# Patient Record
Sex: Female | Born: 1985 | Hispanic: No | Marital: Single | Smoking: Never smoker
Health system: Southern US, Community
[De-identification: ages and names within clinical notes are randomized; demographics above are authoritative.]

## PROBLEM LIST (undated history)

## (undated) DIAGNOSIS — C50919 Malignant neoplasm of unspecified site of unspecified female breast: Secondary | ICD-10-CM

## (undated) DIAGNOSIS — Z8481 Family history of carrier of genetic disease: Secondary | ICD-10-CM

## (undated) DIAGNOSIS — Z803 Family history of malignant neoplasm of breast: Secondary | ICD-10-CM

## (undated) DIAGNOSIS — R000 Tachycardia, unspecified: Secondary | ICD-10-CM

## (undated) DIAGNOSIS — R51 Headache: Secondary | ICD-10-CM

## (undated) DIAGNOSIS — R519 Headache, unspecified: Secondary | ICD-10-CM

## (undated) DIAGNOSIS — R569 Unspecified convulsions: Secondary | ICD-10-CM

## (undated) DIAGNOSIS — M797 Fibromyalgia: Secondary | ICD-10-CM

## (undated) DIAGNOSIS — Z806 Family history of leukemia: Secondary | ICD-10-CM

## (undated) DIAGNOSIS — I951 Orthostatic hypotension: Secondary | ICD-10-CM

## (undated) DIAGNOSIS — S069X9A Unspecified intracranial injury with loss of consciousness of unspecified duration, initial encounter: Secondary | ICD-10-CM

## (undated) DIAGNOSIS — N8003 Adenomyosis of the uterus: Secondary | ICD-10-CM

## (undated) DIAGNOSIS — Z9889 Other specified postprocedural states: Secondary | ICD-10-CM

## (undated) DIAGNOSIS — N809 Endometriosis, unspecified: Secondary | ICD-10-CM

## (undated) DIAGNOSIS — A1801 Tuberculosis of spine: Secondary | ICD-10-CM

## (undated) DIAGNOSIS — N800 Endometriosis of uterus: Secondary | ICD-10-CM

## (undated) HISTORY — PX: APPENDECTOMY: SHX54

## (undated) HISTORY — PX: BREAST SURGERY: SHX581

## (undated) HISTORY — PX: ENDOMETRIAL ABLATION: SHX621

## (undated) HISTORY — DX: Malignant neoplasm of unspecified site of unspecified female breast: C50.919

## (undated) HISTORY — DX: Family history of leukemia: Z80.6

## (undated) HISTORY — DX: Family history of malignant neoplasm of breast: Z80.3

## (undated) HISTORY — DX: Family history of carrier of genetic disease: Z84.81

## (undated) HISTORY — PX: KNEE ARTHROSCOPY: SHX127

---

## 2012-04-15 DIAGNOSIS — G90A Postural orthostatic tachycardia syndrome (POTS): Secondary | ICD-10-CM

## 2012-04-15 DIAGNOSIS — I498 Other specified cardiac arrhythmias: Secondary | ICD-10-CM

## 2012-04-15 HISTORY — DX: Postural orthostatic tachycardia syndrome (POTS): G90.A

## 2012-04-15 HISTORY — DX: Other specified cardiac arrhythmias: I49.8

## 2014-12-06 ENCOUNTER — Emergency Department (HOSPITAL_COMMUNITY): Payer: Self-pay

## 2014-12-06 ENCOUNTER — Encounter (HOSPITAL_COMMUNITY): Payer: Self-pay | Admitting: Emergency Medicine

## 2014-12-06 ENCOUNTER — Emergency Department (HOSPITAL_COMMUNITY)
Admission: EM | Admit: 2014-12-06 | Discharge: 2014-12-06 | Disposition: A | Discharge status: Home / Self Care | Payer: Self-pay | Attending: Emergency Medicine | Admitting: Emergency Medicine

## 2014-12-06 DIAGNOSIS — R61 Generalized hyperhidrosis: Secondary | ICD-10-CM | POA: Insufficient documentation

## 2014-12-06 DIAGNOSIS — N938 Other specified abnormal uterine and vaginal bleeding: Secondary | ICD-10-CM | POA: Insufficient documentation

## 2014-12-06 DIAGNOSIS — R1032 Left lower quadrant pain: Secondary | ICD-10-CM | POA: Insufficient documentation

## 2014-12-06 DIAGNOSIS — Z3202 Encounter for pregnancy test, result negative: Secondary | ICD-10-CM | POA: Insufficient documentation

## 2014-12-06 DIAGNOSIS — Z880 Allergy status to penicillin: Secondary | ICD-10-CM | POA: Insufficient documentation

## 2014-12-06 DIAGNOSIS — Z8619 Personal history of other infectious and parasitic diseases: Secondary | ICD-10-CM | POA: Insufficient documentation

## 2014-12-06 DIAGNOSIS — Z8742 Personal history of other diseases of the female genital tract: Secondary | ICD-10-CM | POA: Insufficient documentation

## 2014-12-06 HISTORY — DX: Adenomyosis of the uterus: N80.03

## 2014-12-06 HISTORY — DX: Endometriosis of uterus: N80.0

## 2014-12-06 HISTORY — DX: Endometriosis, unspecified: N80.9

## 2014-12-06 HISTORY — DX: Tuberculosis of spine: A18.01

## 2014-12-06 LAB — COMPREHENSIVE METABOLIC PANEL
ALBUMIN: 3.8 g/dL (ref 3.5–5.0)
ALT: 11 U/L — ABNORMAL LOW (ref 14–54)
ANION GAP: 6 (ref 5–15)
AST: 18 U/L (ref 15–41)
Alkaline Phosphatase: 68 U/L (ref 38–126)
BUN: 11 mg/dL (ref 6–20)
CHLORIDE: 110 mmol/L (ref 101–111)
CO2: 24 mmol/L (ref 22–32)
Calcium: 8.8 mg/dL — ABNORMAL LOW (ref 8.9–10.3)
Creatinine, Ser: 0.87 mg/dL (ref 0.44–1.00)
GFR calc Af Amer: >60 mL/min (ref >60)
GFR calc non Af Amer: >60 mL/min (ref >60)
GLUCOSE: 103 mg/dL — AB (ref 65–99)
POTASSIUM: 3.6 mmol/L (ref 3.5–5.1)
Sodium: 140 mmol/L (ref 135–145)
Total Bilirubin: 0.7 mg/dL (ref 0.3–1.2)
Total Protein: 7.2 g/dL (ref 6.5–8.1)

## 2014-12-06 LAB — CBC WITH DIFFERENTIAL/PLATELET
BASOS ABS: 0 10*3/uL (ref 0.0–0.1)
Basophils Relative: 0 % (ref 0–1)
EOS PCT: 1 % (ref 0–5)
Eosinophils Absolute: 0.1 10*3/uL (ref 0.0–0.7)
HEMATOCRIT: 36.1 % (ref 36.0–46.0)
Hemoglobin: 12.1 g/dL (ref 12.0–15.0)
LYMPHS ABS: 1.7 10*3/uL (ref 0.7–4.0)
LYMPHS PCT: 21 % (ref 12–46)
MCH: 27.8 pg (ref 26.0–34.0)
MCHC: 33.5 g/dL (ref 30.0–36.0)
MCV: 82.8 fL (ref 78.0–100.0)
MONO ABS: 0.4 10*3/uL (ref 0.1–1.0)
Monocytes Relative: 4 % (ref 3–12)
NEUTROS ABS: 5.8 10*3/uL (ref 1.7–7.7)
Neutrophils Relative %: 74 % (ref 43–77)
PLATELETS: 307 10*3/uL (ref 150–400)
RBC: 4.36 MIL/uL (ref 3.87–5.11)
RDW: 14.7 % (ref 11.5–15.5)
WBC: 7.9 10*3/uL (ref 4.0–10.5)

## 2014-12-06 LAB — I-STAT BETA HCG BLOOD, ED (MC, WL, AP ONLY): I-stat hCG, quantitative: <5 m[IU]/mL (ref <5)

## 2014-12-06 LAB — WET PREP, GENITAL
CLUE CELLS WET PREP: NONE SEEN
TRICH WET PREP: NONE SEEN
Yeast Wet Prep HPF POC: NONE SEEN

## 2014-12-06 MED ORDER — ONDANSETRON 4 MG PO TBDP: 4.0000 mg | ORAL_TABLET | Freq: Two times a day (BID) | ORAL | Status: DC

## 2014-12-06 MED ORDER — ONDANSETRON 4 MG PO TBDP
ORAL_TABLET | ORAL | Status: AC
  Filled 2014-12-06: qty 2

## 2014-12-06 MED ORDER — ONDANSETRON 4 MG PO TBDP
8.0000 mg | ORAL_TABLET | Freq: Once | ORAL | Status: AC
  Administered 2014-12-06: 8 mg via ORAL

## 2014-12-06 MED ORDER — HYDROCODONE-ACETAMINOPHEN 5-325 MG PO TABS
1.0000 | ORAL_TABLET | Freq: Once | ORAL | Status: AC
  Administered 2014-12-06: 1 via ORAL
  Filled 2014-12-06: qty 1

## 2014-12-06 MED ORDER — HYDROCODONE-ACETAMINOPHEN 5-325 MG PO TABS: 1.0000 | ORAL_TABLET | Freq: Four times a day (QID) | ORAL | Status: DC | PRN

## 2014-12-06 NOTE — ED Provider Notes (Signed)
CSN: [id number]     Arrival date & time 12/06/14  1055 History  This chart was scribed for non-physician practitioner, Junius Creamer, NP, working with Merrily Pew, MD, by Stephania Fragmin, ED Scribe. This patient was seen in room TR03C/TR03C and the patient's care was started at 1:25 PM.    Chief Complaint  Patient presents with  . Abdominal Pain   The history is provided by the patient. No language interpreter was used.    HPI Comments: Joanna Harris is a 29 y.o. female with a history of endometriosis and ovarian cysts, who presents to the Emergency Department complaining of constant, gradually improving vaginal bleeding and concurrent LLQ abdominal pain that began 3 days ago, she started her period. Patient states this feels different than her typical periods because she was nauseated and diaphoretic when she woke up this morning. She reports she has been passing large blood clots, the size of a quarter, but this has lightened up since onset; the abdominal pain has been unchanged since onset. She also took Aleve, which normally alleviates her abdominal cramping, with no relief. Patient called her OB-GYN in [place], who told her to come to the ED. Patient had a last normal BM a couple hours ago, with no relief to her symptoms. She notes a history of endometriosis but states her symptoms today feel more severe. She states she last had issues with ovarian cysts over 10 years ago. She denies a history of diverticulitis or any issues with constipation. She denies dysuria. She also denies any vaginal discharge prior to the onset of her abdominal pain. She also denies the possibility of pregnancy.   Past Medical History  Diagnosis Date  . Endometriosis   . Adenomyosis of uterus determined by biopsy   . Potts disease    No past surgical history on file. No family history on file. Social History  Substance Use Topics  . Smoking status: Not on file  . Smokeless tobacco: Not on file  . Alcohol Use: Not  on file   OB History    No data available     Review of Systems  Constitutional: Positive for diaphoresis. Negative for fever and chills.  Respiratory: Negative for cough and shortness of breath.   Cardiovascular: Negative for chest pain.  Gastrointestinal: Positive for nausea and abdominal pain. Negative for vomiting, diarrhea and constipation.  Genitourinary: Positive for vaginal bleeding. Negative for dysuria, vaginal discharge and vaginal pain.  Musculoskeletal: Negative for myalgias.  Skin: Negative for rash.  All other systems reviewed and are negative.     Allergies  Amoxicillin and Ketorolac  Home Medications   Prior to Admission medications   Not on File   BP 116/85 mmHg  Pulse 86  Temp(Src) 97.9 F (36.6 C) (Oral)  Resp 18  SpO2 99%  LMP 12/03/2014 Physical Exam  Constitutional: She is oriented to person, place, and time. She appears well-developed and well-nourished. No distress.  HENT:  Head: Normocephalic and atraumatic.  Mouth/Throat: Oropharynx is clear and moist.  Eyes: Conjunctivae and EOM are normal. Pupils are equal, round, and reactive to light.  Neck: Normal range of motion. Neck supple. Tracheal deviation present.  Cardiovascular: Normal rate and regular rhythm.   Pulmonary/Chest: Effort normal and breath sounds normal.  Abdominal: Soft. Bowel sounds are normal. She exhibits no distension. There is tenderness in the suprapubic area and left lower quadrant. There is no rigidity and no guarding.  Genitourinary: Vagina normal. Cervix exhibits no discharge. Right adnexum displays tenderness. Right  adnexum displays no mass. Left adnexum displays tenderness. Left adnexum displays no mass. No vaginal discharge found.  Musculoskeletal: Normal range of motion.  Neurological: She is alert and oriented to person, place, and time.  Skin: Skin is warm and dry.  Psychiatric: She has a normal mood and affect. Her behavior is normal.  Nursing note and vitals  reviewed.   ED Course  Procedures (including critical care time)  DIAGNOSTIC STUDIES: Oxygen Saturation is 99% on RA, normal by my interpretation.    COORDINATION OF CARE: 1:27 PM - Discussed treatment plan with pt at bedside which includes pelvic exam and diagnostic tests. Pt verbalized understanding and agreed to plan.  Labs Review Labs Reviewed  WET PREP, GENITAL - Abnormal; Notable for the following:    WBC, Wet Prep HPF POC FEW (*)    All other components within normal limits  COMPREHENSIVE METABOLIC PANEL - Abnormal; Notable for the following:    Glucose, Bld 103 (*)    Calcium 8.8 (*)    ALT 11 (*)    All other components within normal limits  CBC WITH DIFFERENTIAL/PLATELET  I-STAT BETA HCG BLOOD, ED ([facility], [facility], [facility] ONLY)  GC/CHLAMYDIA PROBE AMP ([place]) NOT AT [facility]    Imaging Review US Transvaginal Non-ob  12/06/2014   CLINICAL DATA:  Patient with 3 days of left lower quadrant pain.  EXAM: TRANSABDOMINAL AND TRANSVAGINAL ULTRASOUND OF PELVIS  TECHNIQUE: Both transabdominal and transvaginal ultrasound examinations of the pelvis were performed. Transabdominal technique was performed for global imaging of the pelvis including uterus, ovaries, adnexal regions, and pelvic cul-de-sac. It was necessary to proceed with endovaginal exam following the transabdominal exam to visualize the adnexal structures.  COMPARISON:  None  FINDINGS: Uterus  Measurements: 6.9 x 3.4 x 4.3 cm. No fibroids or other mass visualized.  Endometrium  Thickness: 7 mm.  No focal abnormality visualized.  Right ovary  Measurements: 2.9 x 2.6 x 2.8 cm. Normal appearance/no adnexal mass.  Left ovary  Measurements: 2.7 x 1.4 x 2.2 cm. Normal appearance/no adnexal mass.  Other findings  Trace free fluid in the pelvis.  IMPRESSION: Unremarkable pelvic ultrasound.   Electronically Signed   By: Lovey Newcomer M.D.   On: 12/06/2014 16:11   US Pelvis Complete  12/06/2014   CLINICAL DATA:  Patient with 3 days of left lower  quadrant pain.  EXAM: TRANSABDOMINAL AND TRANSVAGINAL ULTRASOUND OF PELVIS  TECHNIQUE: Both transabdominal and transvaginal ultrasound examinations of the pelvis were performed. Transabdominal technique was performed for global imaging of the pelvis including uterus, ovaries, adnexal regions, and pelvic cul-de-sac. It was necessary to proceed with endovaginal exam following the transabdominal exam to visualize the adnexal structures.  COMPARISON:  None  FINDINGS: Uterus  Measurements: 6.9 x 3.4 x 4.3 cm. No fibroids or other mass visualized.  Endometrium  Thickness: 7 mm.  No focal abnormality visualized.  Right ovary  Measurements: 2.9 x 2.6 x 2.8 cm. Normal appearance/no adnexal mass.  Left ovary  Measurements: 2.7 x 1.4 x 2.2 cm. Normal appearance/no adnexal mass.  Other findings  Trace free fluid in the pelvis.  IMPRESSION: Unremarkable pelvic ultrasound.   Electronically Signed   By: Lovey Newcomer M.D.   On: 12/06/2014 16:11   I have personally reviewed and evaluated these images and lab results as part of my medical decision-making.   EKG Interpretation None     patient's ultrasound reviewed.  Labs, urine reviewed.  Wet prep, reviewed all within normal parameters.  KUB was  obtained.  For thoroughness sake, which was normal.  Patient now informs me that she has surgery planned for endometriosis through her OB/GYN.  End of September.  She has been given a prescription for Vicodin 10 tablets that she can use for severe pain, as well as a densitometry for nausea  MDM   Final diagnoses:  None    I personally performed the services described in this documentation, which was scribed in my presence. The recorded information has been reviewed and is accurate.    Junius Creamer, NP 12/06/14 1707  Merrily Pew, MD 12/09/14 [phone]

## 2014-12-06 NOTE — ED Notes (Signed)
Pt to ultrasound at this time.

## 2014-12-06 NOTE — Discharge Instructions (Signed)
Abdominal Pain Many things can cause belly (abdominal) pain. Most times, the belly pain is not dangerous. Many cases of belly pain can be watched and treated at home. HOME CARE   Do not take medicines that help you go poop (laxatives) unless told to by your doctor.  Only take medicine as told by your doctor.  Eat or drink as told by your doctor. Your doctor will tell you if you should be on a special diet. GET HELP IF:  You do not know what is causing your belly pain.  You have belly pain while you are sick to your stomach (nauseous) or have runny poop (diarrhea).  You have pain while you pee or poop.  Your belly pain wakes you up at night.  You have belly pain that gets worse or better when you eat.  You have belly pain that gets worse when you eat fatty foods.  You have a fever. GET HELP RIGHT AWAY IF:   The pain does not go away within 2 hours.  You keep throwing up (vomiting).  The pain changes and is only in the right or left part of the belly.  You have bloody or tarry looking poop. MAKE SURE YOU:   Understand these instructions.  Will watch your condition.  Will get help right away if you are not doing well or get worse. Document Released: 09/18/2007 Document Revised: 04/06/2013 Document Reviewed: 12/09/2012 [facility] Patient Information 2015 [place], [place]. This information is not intended to replace advice given to you by your health care provider. Make sure you discuss any questions you have with your health care provider. Today your ultrasound, x-ray, urine and labs are all within normal parameters You have been given a medication called dancer try and which is for nausea and hydrocodone that he can take for pain.  Please keep your appointment with your OB/GYN

## 2014-12-06 NOTE — ED Notes (Signed)
Pt has left sided abdominal pain; thinks has had fevers; tender to palpation; appendectomy in 2000. Is on period but states this pain is much worse. States she has endometriosis and ovarian cysts but has been vomiting and diaphoretic. States this is not how usually feels when on period. Passing large clots and bleeding then stops.

## 2014-12-07 LAB — GC/CHLAMYDIA PROBE AMP (~~LOC~~) NOT AT ARMC
CHLAMYDIA, DNA PROBE: POSITIVE — AB
NEISSERIA GONORRHEA: NEGATIVE

## 2014-12-08 ENCOUNTER — Telehealth (HOSPITAL_BASED_OUTPATIENT_CLINIC_OR_DEPARTMENT_OTHER): Payer: Self-pay | Admitting: Emergency Medicine

## 2014-12-08 NOTE — Telephone Encounter (Signed)
Chart handoff to EDP for treatment plan + Chlamydia

## 2014-12-09 ENCOUNTER — Telehealth (HOSPITAL_BASED_OUTPATIENT_CLINIC_OR_DEPARTMENT_OTHER): Payer: Self-pay | Admitting: Emergency Medicine

## 2014-12-10 ENCOUNTER — Telehealth (HOSPITAL_COMMUNITY): Payer: Self-pay | Admitting: Emergency Medicine

## 2014-12-10 NOTE — Telephone Encounter (Signed)
Post ED Visit - Positive Culture Follow-up: Successful Patient Follow-Up   Positive Chlamydia culture  [x]  Patient discharged without antimicrobial prescription and treatment is now indicated []  Organism is resistant to prescribed ED discharge antimicrobial []  Patient with positive blood cultures  Changes discussed with ED provider: Blanchie Dessert, MD New antibiotic prescription: Azithromycin 1, 000 mg PO once Called to [facility]  Contacted patient, date 12/10/14, time 1208   Ernesta Amble 12/10/2014, 12:17 PM

## 2015-01-04 ENCOUNTER — Encounter (HOSPITAL_COMMUNITY): Payer: Self-pay | Admitting: Emergency Medicine

## 2015-01-04 ENCOUNTER — Emergency Department (HOSPITAL_COMMUNITY)
Admission: EM | Admit: 2015-01-04 | Discharge: 2015-01-04 | Disposition: A | Discharge status: Home / Self Care | Payer: Self-pay | Attending: Emergency Medicine | Admitting: Emergency Medicine

## 2015-01-04 DIAGNOSIS — I498 Other specified cardiac arrhythmias: Secondary | ICD-10-CM | POA: Insufficient documentation

## 2015-01-04 DIAGNOSIS — I951 Orthostatic hypotension: Secondary | ICD-10-CM

## 2015-01-04 DIAGNOSIS — Z79899 Other long term (current) drug therapy: Secondary | ICD-10-CM | POA: Insufficient documentation

## 2015-01-04 DIAGNOSIS — G90A Postural orthostatic tachycardia syndrome (POTS): Secondary | ICD-10-CM

## 2015-01-04 DIAGNOSIS — Z8619 Personal history of other infectious and parasitic diseases: Secondary | ICD-10-CM | POA: Insufficient documentation

## 2015-01-04 DIAGNOSIS — R300 Dysuria: Secondary | ICD-10-CM | POA: Insufficient documentation

## 2015-01-04 DIAGNOSIS — Z8742 Personal history of other diseases of the female genital tract: Secondary | ICD-10-CM | POA: Insufficient documentation

## 2015-01-04 DIAGNOSIS — Z9889 Other specified postprocedural states: Secondary | ICD-10-CM | POA: Insufficient documentation

## 2015-01-04 DIAGNOSIS — G8918 Other acute postprocedural pain: Secondary | ICD-10-CM | POA: Insufficient documentation

## 2015-01-04 DIAGNOSIS — Z3202 Encounter for pregnancy test, result negative: Secondary | ICD-10-CM | POA: Insufficient documentation

## 2015-01-04 DIAGNOSIS — R55 Syncope and collapse: Secondary | ICD-10-CM | POA: Insufficient documentation

## 2015-01-04 DIAGNOSIS — R000 Tachycardia, unspecified: Secondary | ICD-10-CM

## 2015-01-04 HISTORY — DX: Other specified postprocedural states: Z98.890

## 2015-01-04 LAB — URINALYSIS, ROUTINE W REFLEX MICROSCOPIC
BILIRUBIN URINE: NEGATIVE
GLUCOSE, UA: NEGATIVE mg/dL
Hgb urine dipstick: NEGATIVE
KETONES UR: NEGATIVE mg/dL
LEUKOCYTES UA: NEGATIVE
NITRITE: NEGATIVE
PH: 8 (ref 5.0–8.0)
Protein, ur: NEGATIVE mg/dL
SPECIFIC GRAVITY, URINE: 1.01 (ref 1.005–1.030)
Urobilinogen, UA: 0.2 mg/dL (ref 0.0–1.0)

## 2015-01-04 LAB — CBC
HEMATOCRIT: 37.4 % (ref 36.0–46.0)
Hemoglobin: 12.2 g/dL (ref 12.0–15.0)
MCH: 27.9 pg (ref 26.0–34.0)
MCHC: 32.6 g/dL (ref 30.0–36.0)
MCV: 85.4 fL (ref 78.0–100.0)
PLATELETS: 313 10*3/uL (ref 150–400)
RBC: 4.38 MIL/uL (ref 3.87–5.11)
RDW: 13.8 % (ref 11.5–15.5)
WBC: 8.4 10*3/uL (ref 4.0–10.5)

## 2015-01-04 LAB — BASIC METABOLIC PANEL
Anion gap: 7 (ref 5–15)
BUN: 6 mg/dL (ref 6–20)
CHLORIDE: 111 mmol/L (ref 101–111)
CO2: 20 mmol/L — AB (ref 22–32)
CREATININE: 0.79 mg/dL (ref 0.44–1.00)
Calcium: 9.1 mg/dL (ref 8.9–10.3)
GFR calc Af Amer: >60 mL/min (ref >60)
GFR calc non Af Amer: >60 mL/min (ref >60)
Glucose, Bld: 95 mg/dL (ref 65–99)
POTASSIUM: 4.1 mmol/L (ref 3.5–5.1)
Sodium: 138 mmol/L (ref 135–145)

## 2015-01-04 LAB — I-STAT TROPONIN, ED: TROPONIN I, POC: 0 ng/mL (ref 0.00–0.08)

## 2015-01-04 LAB — POC URINE PREG, ED: Preg Test, Ur: NEGATIVE

## 2015-01-04 MED ORDER — SODIUM CHLORIDE 0.9 % IV BOLUS (SEPSIS)
1000.0000 mL | Freq: Once | INTRAVENOUS | Status: AC
  Administered 2015-01-04: 1000 mL via INTRAVENOUS

## 2015-01-04 NOTE — Discharge Instructions (Signed)
Stay hydrated. Eat more salt to keep you from passing out.   See your GYN surgeon for follow up.   Return to ER if you have severe pain, passing out, unable to urinate.

## 2015-01-04 NOTE — ED Provider Notes (Signed)
CSN: [id number]     Arrival date & time 01/04/15  1938 History   First MD Initiated Contact with Patient 01/04/15 2105     Chief Complaint  Patient presents with  . Urinary Frequency  . Loss of Consciousness     (Consider location/radiation/quality/duration/timing/severity/associated sxs/prior Treatment) The history is provided by the patient.  Joanna Harris is a 29 y.o. female hx of endometriosis, POTS, here presenting with urinary frequency, bladder pain. Patient had endometrial ablation done at [facility] 6 days ago. Went to ED 5 days ago with urinary frequency and bladder pressure and was thought to have normal postop pain. She continues to have some blood pressure and frequency. Also passed out twice today. Felt lightheaded and dizzy. She does have a history of POTS. Denies chest pain and no hx of CAD.     Past Medical History  Diagnosis Date  . Endometriosis   . Adenomyosis of uterus determined by biopsy   . Potts disease   . S/P endometrial ablation    History reviewed. No pertinent past surgical history. No family history on file. Social History  Substance Use Topics  . Smoking status: Never Smoker   . Smokeless tobacco: None  . Alcohol Use: No   OB History    No data available     Review of Systems  Cardiovascular: Positive for syncope.  Genitourinary: Positive for frequency.  All other systems reviewed and are negative.     Allergies  Amoxicillin; Ketorolac; and Ibuprofen  Home Medications   Prior to Admission medications   Medication Sig Start Date End Date Taking? Authorizing Provider  amphetamine-dextroamphetamine (ADDERALL) 5 MG tablet Take 60 mg by mouth daily as needed (thought stabilizer).    Yes Historical Provider, MD  B Complex Vitamins (VITAMIN-B COMPLEX) TABS Take 1 tablet by mouth daily.   Yes Historical Provider, MD  BIOTIN PO Take 1 tablet by mouth daily.   Yes Historical Provider, MD  calcium elemental as carbonate (PX ANTACID MAXIMUM  STRENGTH) 400 MG tablet Chew 1 tablet by mouth daily.   Yes Historical Provider, MD  cetirizine (ZYRTEC) 10 MG tablet Take 10 mg by mouth daily.   Yes Historical Provider, MD  Cholecalciferol (PA VITAMIN D-3) 2000 UNITS CAPS Take 1 tablet by mouth daily.   Yes Historical Provider, MD  ferrous sulfate 325 (65 FE) MG tablet Take 325 mg by mouth every other day.   Yes Historical Provider, MD  Ginkgo Biloba 500 MG CAPS Take 1 tablet by mouth daily.   Yes Historical Provider, MD  Magnesium 250 MG TABS Take 500 mg by mouth every other day.   Yes Historical Provider, MD  niacin (NIASPAN) 1000 MG CR tablet Take 1,000 mg by mouth daily.   Yes Historical Provider, MD  ondansetron (ZOFRAN-ODT) 4 MG disintegrating tablet Take 1 tablet (4 mg total) by mouth 2 (two) times daily. 12/06/14  Yes Junius Creamer, NP  oxyCODONE (OXY IR/ROXICODONE) 5 MG immediate release tablet Take 10 mg by mouth every 4 (four) hours as needed. 12/31/14  Yes Historical Provider, MD  oxyCODONE-acetaminophen (PERCOCET) 7.5-325 MG per tablet TK 1 TO 2 TS PO Q 6 H PRN P 12/30/14  Yes Historical Provider, MD  topiramate (TOPAMAX) 100 MG tablet Take 300 mg by mouth daily.   Yes Historical Provider, MD  ALPRAZolam Duanne Moron) 0.5 MG tablet Take 0.5 mg by mouth 3 (three) times daily as needed.    Historical Provider, MD   BP 119/72 mmHg  Pulse 67  Temp(Src)  98.3 F (36.8 C) (Oral)  Resp 16  Ht 5\' 2"  (1.575 m)  Wt 227 lb (102.967 kg)  BMI 41.51 kg/m2  LMP 12/04/2014 Physical Exam  Constitutional: She is oriented to person, place, and time. She appears well-developed and well-nourished.  HENT:  Head: Normocephalic.  Mouth/Throat: Oropharynx is clear and moist.  Eyes: Conjunctivae are normal. Pupils are equal, round, and reactive to light.  Neck: Normal range of motion. Neck supple.  Cardiovascular: Normal rate, regular rhythm and normal heart sounds.   Pulmonary/Chest: Effort normal and breath sounds normal. No respiratory distress. She has  no wheezes. She has no rales.  Abdominal: Soft. Bowel sounds are normal. She exhibits no distension. There is no tenderness. There is no rebound.  Scars healing well. Minimal suprapubic tenderness   Musculoskeletal: Normal range of motion. She exhibits no edema or tenderness.  Neurological: She is alert and oriented to person, place, and time. No cranial nerve deficit. Coordination normal.  Skin: Skin is warm and dry.  Psychiatric: She has a normal mood and affect. Her behavior is normal. Judgment and thought content normal.  Nursing note and vitals reviewed.   ED Course  Procedures (including critical care time) Labs Review Labs Reviewed  BASIC METABOLIC PANEL - Abnormal; Notable for the following:    CO2 20 (*)    All other components within normal limits  CBC  URINALYSIS, ROUTINE W REFLEX MICROSCOPIC (NOT AT [facility])  POC URINE PREG, ED  I-STAT TROPOININ, ED    Imaging Review No results found. I have personally reviewed and evaluated these images and lab results as part of my medical decision-making.   EKG Interpretation   Date/Time:  Wednesday January 04 2015 20:16:47 EDT Ventricular Rate:  62 PR Interval:  104 QRS Duration: 76 QT Interval:  404 QTC Calculation: 410 R Axis:   95 Text Interpretation:  Sinus rhythm with short PR Rightward axis Borderline  ECG No previous ECGs available Confirmed by YAO  MD, DAVID ([id number]) on  01/04/2015 9:01:55 PM      MDM   Final diagnoses:  None    Joanna Harris is a 29 y.o. female here with syncope, urinary frequency. Likely postop urinary retention vs postop pain vs POTS. Will get labs, bladder scan, UA, orthostatics.   11:06 PM Not orthostatic. UA nl. Bladder scan 100. Bicarb 20, given IVF. Ambulated with no syncope. Will dc home.     Wandra Arthurs, MD 01/04/15 [phone]

## 2015-01-04 NOTE — ED Notes (Signed)
Pt. reports urinary retention/frequency  , bladder pain/pressure onset this week , S/P endometrial ablation last Friday at [facility] , pt. added syncopal episode x2 today . Alert and oriented / respirations unlabored.

## 2015-01-04 NOTE — ED Notes (Signed)
Pt stable, ambulatory, states understanding of discharge instructions 

## 2015-05-21 DIAGNOSIS — E669 Obesity, unspecified: Secondary | ICD-10-CM | POA: Insufficient documentation

## 2015-05-21 DIAGNOSIS — R531 Weakness: Secondary | ICD-10-CM | POA: Insufficient documentation

## 2015-05-21 DIAGNOSIS — Z853 Personal history of malignant neoplasm of breast: Secondary | ICD-10-CM | POA: Insufficient documentation

## 2015-05-21 DIAGNOSIS — R110 Nausea: Secondary | ICD-10-CM | POA: Insufficient documentation

## 2015-05-21 DIAGNOSIS — Z79899 Other long term (current) drug therapy: Secondary | ICD-10-CM | POA: Insufficient documentation

## 2015-05-21 DIAGNOSIS — M5432 Sciatica, left side: Secondary | ICD-10-CM | POA: Insufficient documentation

## 2015-05-21 DIAGNOSIS — Z8742 Personal history of other diseases of the female genital tract: Secondary | ICD-10-CM | POA: Insufficient documentation

## 2015-05-21 DIAGNOSIS — Z880 Allergy status to penicillin: Secondary | ICD-10-CM | POA: Insufficient documentation

## 2015-05-21 DIAGNOSIS — Z8619 Personal history of other infectious and parasitic diseases: Secondary | ICD-10-CM | POA: Insufficient documentation

## 2015-05-21 DIAGNOSIS — Z3202 Encounter for pregnancy test, result negative: Secondary | ICD-10-CM | POA: Insufficient documentation

## 2015-05-21 DIAGNOSIS — R200 Anesthesia of skin: Secondary | ICD-10-CM | POA: Insufficient documentation

## 2015-05-22 ENCOUNTER — Encounter (HOSPITAL_COMMUNITY): Payer: Self-pay | Admitting: *Deleted

## 2015-05-22 ENCOUNTER — Emergency Department (HOSPITAL_COMMUNITY)
Admission: EM | Admit: 2015-05-22 | Discharge: 2015-05-22 | Disposition: A | Discharge status: Home / Self Care | Payer: Self-pay | Attending: Emergency Medicine | Admitting: Emergency Medicine

## 2015-05-22 ENCOUNTER — Emergency Department (HOSPITAL_COMMUNITY): Payer: Self-pay

## 2015-05-22 DIAGNOSIS — M5432 Sciatica, left side: Secondary | ICD-10-CM

## 2015-05-22 DIAGNOSIS — M549 Dorsalgia, unspecified: Secondary | ICD-10-CM

## 2015-05-22 LAB — URINALYSIS, ROUTINE W REFLEX MICROSCOPIC
Bilirubin Urine: NEGATIVE
Glucose, UA: NEGATIVE mg/dL
HGB URINE DIPSTICK: NEGATIVE
Ketones, ur: NEGATIVE mg/dL
Leukocytes, UA: NEGATIVE
Nitrite: NEGATIVE
PH: 8 (ref 5.0–8.0)
Protein, ur: NEGATIVE mg/dL
SPECIFIC GRAVITY, URINE: 1.03 (ref 1.005–1.030)

## 2015-05-22 LAB — POC URINE PREG, ED: PREG TEST UR: NEGATIVE

## 2015-05-22 MED ORDER — OXYCODONE-ACETAMINOPHEN 5-325 MG PO TABS
1.0000 | ORAL_TABLET | Freq: Once | ORAL | Status: AC
  Administered 2015-05-22: 1 via ORAL
  Filled 2015-05-22: qty 1

## 2015-05-22 MED ORDER — ONDANSETRON HCL 4 MG/2ML IJ SOLN
4.0000 mg | Freq: Once | INTRAMUSCULAR | Status: AC
  Administered 2015-05-22: 4 mg via INTRAVENOUS
  Filled 2015-05-22: qty 2

## 2015-05-22 MED ORDER — HYDROMORPHONE HCL 1 MG/ML IJ SOLN
1.0000 mg | Freq: Once | INTRAMUSCULAR | Status: AC
  Administered 2015-05-22: 1 mg via INTRAVENOUS
  Filled 2015-05-22: qty 1

## 2015-05-22 MED ORDER — OXYCODONE-ACETAMINOPHEN 5-325 MG PO TABS: 1.0000 | ORAL_TABLET | Freq: Four times a day (QID) | ORAL | Status: DC | PRN

## 2015-05-22 MED ORDER — METHYLPREDNISOLONE 4 MG PO TBPK: ORAL_TABLET | ORAL | Status: DC

## 2015-05-22 NOTE — ED Notes (Signed)
Patient transported to MRI 

## 2015-05-22 NOTE — ED Notes (Signed)
The pt is c/o lt hip and leg numbness and pain for one week.  tonigth she lost her balance and fell   She lost control of her bladder.  No known injury  lmp jan 17

## 2015-05-22 NOTE — ED Provider Notes (Signed)
CSN: [id number]     Arrival date & time 05/21/15  2337 History  By signing my name below, I, Stephania Fragmin, attest that this documentation has been prepared under the direction and in the presence of Merryl Hacker, MD. Electronically Signed: Stephania Fragmin, ED Scribe. 05/22/2015. 6:28 AM.   Chief Complaint  Patient presents with  . Hip Pain   The history is provided by the patient. No language interpreter was used.    HPI Comments: Joanna Harris is a 30 y.o. female with a history of breast cancer in remission, endometriosis, adenomyosis of uterus, and Potts disease, who presents to the Emergency Department complaining of left hip numbness and 7/10 pain  radiating into her lower back and left buttock, that began last week after getting a massage. Her pain acutely worsened today when patient's left leg became weak and she fell, losing control of her bladder. She denies any fall or injuries prior to today. She notes associated tingling and nausea. Patient notes a history of breast cancer but states she is in remission. Sitting exacerbates her pain.She has not taken any treatments or medications for her symptoms. She denies a history of IVDA. She denies the possibility of pregnancy.   Past Medical History  Diagnosis Date  . Endometriosis   . Adenomyosis of uterus determined by biopsy   . Potts disease   . S/P endometrial ablation    History reviewed. No pertinent past surgical history. No family history on file. Social History  Substance Use Topics  . Smoking status: Never Smoker   . Smokeless tobacco: None  . Alcohol Use: No   OB History    No data available     Review of Systems  Constitutional: Negative for fever.  Gastrointestinal: Positive for nausea.  Genitourinary:       Urinary incontinence  Musculoskeletal: Positive for back pain, arthralgias (left hip pain) and gait problem.  Neurological: Positive for weakness and numbness.  All other systems reviewed and are  negative.  Allergies  Amoxicillin; Ketorolac; and Ibuprofen  Home Medications   Prior to Admission medications   Medication Sig Start Date End Date Taking? Authorizing Provider  ALPRAZolam Duanne Moron) 0.5 MG tablet Take 0.5 mg by mouth 3 (three) times daily as needed for anxiety.    Yes Historical Provider, MD  amphetamine-dextroamphetamine (ADDERALL) 5 MG tablet Take 60 mg by mouth daily as needed (thought stabilizer).    Yes Historical Provider, MD  B Complex Vitamins (VITAMIN-B COMPLEX) TABS Take 1 tablet by mouth daily.   Yes Historical Provider, MD  BIOTIN PO Take 1 tablet by mouth daily.   Yes Historical Provider, MD  cetirizine (ZYRTEC) 10 MG tablet Take 10 mg by mouth daily.   Yes Historical Provider, MD  Cholecalciferol (PA VITAMIN D-3) 2000 UNITS CAPS Take 1 tablet by mouth daily.   Yes Historical Provider, MD  ferrous sulfate 325 (65 FE) MG tablet Take 325 mg by mouth every other day.   Yes Historical Provider, MD  Ginkgo Biloba 500 MG CAPS Take 1 tablet by mouth daily.   Yes Historical Provider, MD  lurasidone (LATUDA) 20 MG TABS tablet Take 20 mg by mouth daily.   Yes Historical Provider, MD  Magnesium 250 MG TABS Take 500 mg by mouth every other day.   Yes Historical Provider, MD  niacin (NIASPAN) 1000 MG CR tablet Take 1,000 mg by mouth daily.   Yes Historical Provider, MD  methylPREDNISolone (MEDROL DOSEPAK) 4 MG TBPK tablet Take as directed on  package insert. 05/22/15   Merryl Hacker, MD  oxyCODONE-acetaminophen (PERCOCET/ROXICET) 5-325 MG tablet Take 1-2 tablets by mouth every 6 (six) hours as needed for severe pain. 05/22/15   Merryl Hacker, MD   BP 93/51 mmHg  Pulse 57  Temp(Src) 98.3 F (36.8 C) (Oral)  Resp 22  Wt 225 lb 1 oz (102.088 kg)  SpO2 99%  LMP 05/02/2015 Physical Exam  Constitutional: She is oriented to person, place, and time. She appears well-developed and well-nourished. No distress.  Obese  HENT:  Head: Normocephalic and atraumatic.   Cardiovascular: Normal rate, regular rhythm and normal heart sounds.   No murmur heard. Pulmonary/Chest: Effort normal and breath sounds normal. No respiratory distress. She has no wheezes.  Abdominal: Soft. There is no tenderness.  Musculoskeletal: Normal range of motion.  Positive left straight leg raise, tenderness to palpation over the midline lower lumbar spine without step off or deformity noted  Neurological: She is alert and oriented to person, place, and time.  Decreased strength with plantar dorsiflexion of the left foot, no obvious clonus, normal reflexes  Skin: Skin is warm and dry.  Psychiatric: She has a normal mood and affect. Her behavior is normal.  Nursing note and vitals reviewed.   ED Course  Procedures (including critical care time)  DIAGNOSTIC STUDIES: Oxygen Saturation is 100% on RA, normal by my interpretation.    COORDINATION OF CARE: 3:00 AM - Discussed treatment plan with pt at bedside which includes MRI. Pt verbalized understanding and agreed to plan.   Labs Review Labs Reviewed  URINALYSIS, ROUTINE W REFLEX MICROSCOPIC (NOT AT [facility]) - Abnormal; Notable for the following:    APPearance CLOUDY (*)    All other components within normal limits  POC URINE PREG, ED    Imaging Review Mr Lumbar Spine Wo Contrast  05/22/2015  CLINICAL DATA:  LEFT hip numbness and 7/10 back pain for 1 week after getting a message. Urinary incontinence today. History of breast cancer in remission, Potts disease. EXAM: MRI LUMBAR SPINE WITHOUT CONTRAST TECHNIQUE: Multiplanar, multisequence MR imaging of the lumbar spine was performed. No intravenous contrast was administered. COMPARISON:  Abdominal radiographs December 06, 2014 FINDINGS: Lumbar vertebral bodies and posterior elements are intact and aligned with maintenance of lumbar lordosis. Using the reference level of the last well-formed intervertebral disc as L5-S1, intervertebral disc morphology is maintained with decreased T2  signal within the L5-S1 disc consistent with mild desiccation. No suspicious bone marrow signal. Conus medullaris terminates at L1 and appears normal morphology and signal characteristics. 1-2 mm T1 bright signal along the filum terminale with chemical shift artifact consistent with fibro lipomatosis changes. Included prevertebral and paraspinal soft tissues are normal. Level by level evaluation: L1-2, L2-3, L3-4: No disc bulge, canal stenosis nor neural foraminal narrowing. L4-5: Mild annular bulging, mild facet arthropathy and ligamentum flavum redundancy without canal stenosis. Minimal bilateral caudal neural foraminal narrowing. L5-S1: Small central disc protrusion annular fissure. Mild facet arthropathy and ligamentum flavum redundancy without canal stenosis. Minimal bilateral neural foraminal narrowing. IMPRESSION: No acute lumbar spine fracture or malalignment, no MR findings of metastasis by noncontrast examination. No canal stenosis.  Minimal L5-S1 neural foraminal narrowing. 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 Electronically Signed   By: Elon Alas M.D.   On: 05/22/2015 05:20   I have personally reviewed and evaluated these images and lab results as part of my medical decision-making.   EKG Interpretation None      MDM  Final diagnoses:  Back pain  Sciatica of left side   Patient presents with left back, hip, and leg pain. Describes sciatic-like pain. Reports one episode of loss of bladder. Does have a history of cancer. No other red flags. She has midline lower lumbar pain and a positive straight leg raise. Given urinary incontinence and history of cancer, MRI lumbar spine obtained. Patient was given pain and nausea medication. She is able to void spontaneously but states that she feels that she is not voiding completely. Postvoid residual approximate 45 mL.  MRI without evidence of metastasis or other compromise. Patient is ambulatory  independently. Discuss with patient pain medication at home and a Medrol dose pack for inflammation. She is follow-up with her PCP in 2 days for recheck. She was given return precautions.  After history, exam, and medical workup I feel the patient has been appropriately medically screened and is safe for discharge home. Pertinent diagnoses were discussed with the patient. Patient was given return precautions.  I personally performed the services described in this documentation, which was scribed in my presence. The recorded information has been reviewed and is accurate.     Merryl Hacker, MD 05/22/15 0630

## 2015-05-22 NOTE — ED Notes (Signed)
Dr. Horton at bedside at this time.  

## 2015-05-22 NOTE — Discharge Instructions (Signed)

## 2015-05-22 NOTE — ED Notes (Signed)
Notified MRI that patient is ready.

## 2015-05-27 ENCOUNTER — Emergency Department (HOSPITAL_COMMUNITY)
Admission: EM | Admit: 2015-05-27 | Discharge: 2015-05-27 | Disposition: A | Discharge status: Home / Self Care | Payer: Self-pay | Attending: Emergency Medicine | Admitting: Emergency Medicine

## 2015-05-27 ENCOUNTER — Encounter (HOSPITAL_COMMUNITY): Payer: Self-pay | Admitting: Family Medicine

## 2015-05-27 DIAGNOSIS — R300 Dysuria: Secondary | ICD-10-CM | POA: Insufficient documentation

## 2015-05-27 DIAGNOSIS — M5442 Lumbago with sciatica, left side: Secondary | ICD-10-CM | POA: Insufficient documentation

## 2015-05-27 DIAGNOSIS — Z8742 Personal history of other diseases of the female genital tract: Secondary | ICD-10-CM | POA: Insufficient documentation

## 2015-05-27 DIAGNOSIS — Z79899 Other long term (current) drug therapy: Secondary | ICD-10-CM | POA: Insufficient documentation

## 2015-05-27 DIAGNOSIS — Z880 Allergy status to penicillin: Secondary | ICD-10-CM | POA: Insufficient documentation

## 2015-05-27 LAB — URINALYSIS, ROUTINE W REFLEX MICROSCOPIC
BILIRUBIN URINE: NEGATIVE
GLUCOSE, UA: NEGATIVE mg/dL
Hgb urine dipstick: NEGATIVE
Ketones, ur: NEGATIVE mg/dL
Nitrite: NEGATIVE
PH: 6 (ref 5.0–8.0)
Protein, ur: NEGATIVE mg/dL
SPECIFIC GRAVITY, URINE: 1.021 (ref 1.005–1.030)

## 2015-05-27 LAB — URINE MICROSCOPIC-ADD ON: RBC / HPF: NONE SEEN RBC/hpf (ref 0–5)

## 2015-05-27 MED ORDER — DIAZEPAM 5 MG PO TABS
5.0000 mg | ORAL_TABLET | Freq: Once | ORAL | Status: AC
  Administered 2015-05-27: 5 mg via ORAL
  Filled 2015-05-27: qty 1

## 2015-05-27 MED ORDER — LIDOCAINE 5 % EX PTCH: 1.0000 | MEDICATED_PATCH | CUTANEOUS | Status: DC

## 2015-05-27 NOTE — ED Notes (Signed)
PA at bedside.

## 2015-05-27 NOTE — Discharge Instructions (Signed)
Please read and follow all provided instructions.  Your diagnoses today include:  1. Left-sided low back pain with left-sided sciatica    Tests performed today include:  Vital signs - see below for your results today  Medications prescribed:   Take any prescribed medications only as directed.  Home care instructions:   Follow any educational materials contained in this packet  Please rest, use ice or heat on your back for the next several days  Do not lift, push, pull anything more than 10 pounds for the next week  Follow-up instructions: Please follow-up with your primary care provider at your scheduled appointment  Return instructions:  [place] IF YOU HAVE:  New numbness, tingling, weakness, or problem with the use of your arms or legs  Severe back pain not relieved with medications  Loss control of your bowels or bladder  Increasing pain in any areas of the body (such as chest or abdominal pain)  Shortness of breath, dizziness, or fainting.   Worsening nausea (feeling sick to your stomach), vomiting, fever, or sweats  Any other emergent concerns regarding your health   Additional Information:  Your vital signs today were: BP 108/70 mmHg   Pulse 50   Temp(Src) 98 F (36.7 C) (Oral)   Resp 18   SpO2 100%   LMP 05/02/2015 If your blood pressure (BP) was elevated above 135/85 this visit, please have this repeated by your doctor within one month. --------------

## 2015-05-27 NOTE — ED Notes (Signed)
Pt from home for eval of worsening left lower back pain/hip pain. Pt states now pain has moved to her pelvis, denies any vaginal discharge but reports some pressure to bladder. Pt also reports some urinary incontinence when she bends to pick something up a certain way, pt has never had any pregnancies. Pt reports worsening pain and movement to left leg as well.    Pt denies any n/v/d or fevers at this time. Nad ntoed.

## 2015-05-27 NOTE — ED Notes (Signed)
Pt here for increased back pain and pelvic pain. sts seen here recently and the symptoms have progressed. sts urinary incontinence.

## 2015-05-27 NOTE — ED Provider Notes (Signed)
CSN: [id number]     Arrival date & time 05/27/15  1217 History   First MD Initiated Contact with Patient 05/27/15 1318     Chief Complaint  Patient presents with  . Back Pain  . Dysuria   (Consider location/radiation/quality/duration/timing/severity/associated sxs/prior Treatment) HPI 30 y.o. female with a hx of breast cancer in remission, endometriosis, presents to the Emergency Department today complaining of continued back pain/ left hip numbness from previous ED visit on 05-21-15. Pt noted that she had another episode of urinary incontinence while bending over and reaching for an object. Notes similar episode at previous ED visit. Continued left hip numbness consistent with Sciatica as well as left leg numbness to knee. Denies any trauma. No fevers. Endorses similar back pain from previous visit. States that the steroids and narcotics did not really help. Sitting appears to exacerbate back pain. Pain goes away with standing. No hx IVDA. Not pregnant. No CP/SOB/ABD pain. No N/V/D. No other symptoms noted.      Past Medical History  Diagnosis Date  . Endometriosis   . Adenomyosis of uterus determined by biopsy   . Potts disease   . S/P endometrial ablation    History reviewed. No pertinent past surgical history. History reviewed. No pertinent family history. Social History  Substance Use Topics  . Smoking status: Never Smoker   . Smokeless tobacco: None  . Alcohol Use: No   OB History    No data available     Review of Systems ROS reviewed and all are negative for acute change except as noted in the HPI.  Allergies  Amoxicillin; Ketorolac; and Ibuprofen  Home Medications   Prior to Admission medications   Medication Sig Start Date End Date Taking? Authorizing Provider  ALPRAZolam Duanne Moron) 0.5 MG tablet Take 0.5 mg by mouth 3 (three) times daily as needed for anxiety.     Historical Provider, MD  amphetamine-dextroamphetamine (ADDERALL) 5 MG tablet Take 60 mg by mouth daily as  needed (thought stabilizer).     Historical Provider, MD  B Complex Vitamins (VITAMIN-B COMPLEX) TABS Take 1 tablet by mouth daily.    Historical Provider, MD  BIOTIN PO Take 1 tablet by mouth daily.    Historical Provider, MD  cetirizine (ZYRTEC) 10 MG tablet Take 10 mg by mouth daily.    Historical Provider, MD  Cholecalciferol (PA VITAMIN D-3) 2000 UNITS CAPS Take 1 tablet by mouth daily.    Historical Provider, MD  ferrous sulfate 325 (65 FE) MG tablet Take 325 mg by mouth every other day.    Historical Provider, MD  Ginkgo Biloba 500 MG CAPS Take 1 tablet by mouth daily.    Historical Provider, MD  lurasidone (LATUDA) 20 MG TABS tablet Take 20 mg by mouth daily.    Historical Provider, MD  Magnesium 250 MG TABS Take 500 mg by mouth every other day.    Historical Provider, MD  methylPREDNISolone (MEDROL DOSEPAK) 4 MG TBPK tablet Take as directed on package insert. 05/22/15   Merryl Hacker, MD  niacin (NIASPAN) 1000 MG CR tablet Take 1,000 mg by mouth daily.    Historical Provider, MD  oxyCODONE-acetaminophen (PERCOCET/ROXICET) 5-325 MG tablet Take 1-2 tablets by mouth every 6 (six) hours as needed for severe pain. 05/22/15   Merryl Hacker, MD   BP 103/67 mmHg  Pulse 82  Temp(Src) 98 F (36.7 C) (Oral)  Resp 18  SpO2 96%  LMP 05/02/2015   Physical Exam  Constitutional: She is oriented to  person, place, and time. She appears well-developed and well-nourished.  HENT:  Head: Normocephalic and atraumatic.  Eyes: EOM are normal. Pupils are equal, round, and reactive to light.  Neck: Normal range of motion. Neck supple.  Cardiovascular: Normal rate, regular rhythm and normal heart sounds.   Pulmonary/Chest: Effort normal and breath sounds normal.  Abdominal: Soft.  Musculoskeletal: Normal range of motion.       Lumbar back: She exhibits tenderness and pain. She exhibits normal range of motion.  Positive left straight leg raise with TTP of lumbar spine (L3-L5). No step offs or  deformities appreciated.   Neurological: She is alert and oriented to person, place, and time. She has normal strength. No cranial nerve deficit or sensory deficit.  No motor/sensory loss of BLE  Skin: Skin is warm and dry.  Psychiatric: She has a normal mood and affect. Her behavior is normal. Thought content normal.  Nursing note and vitals reviewed.   ED Course  Procedures (including critical care time) Labs Review Labs Reviewed  URINALYSIS, ROUTINE W REFLEX MICROSCOPIC (NOT AT [facility]) - Abnormal; Notable for the following:    Leukocytes, UA MODERATE (*)    All other components within normal limits  URINE MICROSCOPIC-ADD ON - Abnormal; Notable for the following:    Squamous Epithelial / LPF 6-30 (*)    Bacteria, UA FEW (*)    All other components within normal limits   Imaging Review No results found. I have personally reviewed and evaluated these images and lab results as part of my medical decision-making.   EKG Interpretation None      MDM  I have reviewed relevant laboratory values. I have reviewed relevant imaging studies.I have reviewed the relevant previous healthcare records.I obtained HPI from historian. Patient discussed with supervising physician  ED Course:  Assessment: 45y F with hx cancer in remission presents with back, left hip, left leg pain/numbness since previous ED visit on 05-22-15. Reports second episode of bladder incontinence after bending over and reaching for an object below waist height. Based on HPI, sounds like stress incontinence. Previous work up from last ED visit included MRI, which was unremarkable for mets or compromise. Patient feels as though she has urine left over in bladder. On exam, positive left straight leg raise, tender on palpation of lower lumbar spine. NAD. Non-toxic appearing. VSS. Afebrile. Able to ambulate. No other red flags noted. Will DC with pain medication and have her follow up with PCP for management of stress incontinence as  well as Sciatic pain.   Disposition/Plan:  DC Home Additional Verbal discharge instructions given and discussed with patient.  Pt Instructed to f/u with PCP at scheduled appointment on 06-05-15 Strict return precautions given Pt acknowledges and agrees with plan  Supervising Physician Quintella Reichert, MD   Final diagnoses:  Left-sided low back pain with left-sided sciatica      Shary Decamp, PA-C 05/27/15 1456  Quintella Reichert, MD 05/28/15 [phone]

## 2015-05-29 LAB — URINE CULTURE

## 2015-07-03 ENCOUNTER — Emergency Department (HOSPITAL_COMMUNITY): Payer: BLUE CROSS/BLUE SHIELD

## 2015-07-03 ENCOUNTER — Emergency Department (HOSPITAL_COMMUNITY)
Admission: EM | Admit: 2015-07-03 | Discharge: 2015-07-03 | Disposition: A | Discharge status: Home / Self Care | Payer: BLUE CROSS/BLUE SHIELD | Attending: Emergency Medicine | Admitting: Emergency Medicine

## 2015-07-03 ENCOUNTER — Encounter (HOSPITAL_COMMUNITY): Payer: Self-pay | Admitting: Emergency Medicine

## 2015-07-03 DIAGNOSIS — I951 Orthostatic hypotension: Secondary | ICD-10-CM

## 2015-07-03 DIAGNOSIS — I498 Other specified cardiac arrhythmias: Secondary | ICD-10-CM | POA: Diagnosis not present

## 2015-07-03 DIAGNOSIS — Z3202 Encounter for pregnancy test, result negative: Secondary | ICD-10-CM | POA: Diagnosis not present

## 2015-07-03 DIAGNOSIS — Z8619 Personal history of other infectious and parasitic diseases: Secondary | ICD-10-CM | POA: Insufficient documentation

## 2015-07-03 DIAGNOSIS — Z79899 Other long term (current) drug therapy: Secondary | ICD-10-CM | POA: Insufficient documentation

## 2015-07-03 DIAGNOSIS — R110 Nausea: Secondary | ICD-10-CM | POA: Insufficient documentation

## 2015-07-03 DIAGNOSIS — Z8742 Personal history of other diseases of the female genital tract: Secondary | ICD-10-CM | POA: Insufficient documentation

## 2015-07-03 DIAGNOSIS — R000 Tachycardia, unspecified: Secondary | ICD-10-CM

## 2015-07-03 DIAGNOSIS — R0602 Shortness of breath: Secondary | ICD-10-CM | POA: Diagnosis not present

## 2015-07-03 DIAGNOSIS — R51 Headache: Secondary | ICD-10-CM | POA: Diagnosis not present

## 2015-07-03 DIAGNOSIS — R079 Chest pain, unspecified: Secondary | ICD-10-CM | POA: Insufficient documentation

## 2015-07-03 DIAGNOSIS — G90A Postural orthostatic tachycardia syndrome (POTS): Secondary | ICD-10-CM

## 2015-07-03 DIAGNOSIS — F419 Anxiety disorder, unspecified: Secondary | ICD-10-CM | POA: Diagnosis not present

## 2015-07-03 DIAGNOSIS — R55 Syncope and collapse: Secondary | ICD-10-CM

## 2015-07-03 LAB — CBC
HCT: 38 % (ref 36.0–46.0)
HEMOGLOBIN: 12.4 g/dL (ref 12.0–15.0)
MCH: 28.6 pg (ref 26.0–34.0)
MCHC: 32.6 g/dL (ref 30.0–36.0)
MCV: 87.6 fL (ref 78.0–100.0)
PLATELETS: 259 10*3/uL (ref 150–400)
RBC: 4.34 MIL/uL (ref 3.87–5.11)
RDW: 13.6 % (ref 11.5–15.5)
WBC: 5.7 10*3/uL (ref 4.0–10.5)

## 2015-07-03 LAB — I-STAT TROPONIN, ED: Troponin i, poc: 0 ng/mL (ref 0.00–0.08)

## 2015-07-03 LAB — BASIC METABOLIC PANEL
ANION GAP: 12 (ref 5–15)
BUN: 9 mg/dL (ref 6–20)
CO2: 25 mmol/L (ref 22–32)
Calcium: 9.3 mg/dL (ref 8.9–10.3)
Chloride: 104 mmol/L (ref 101–111)
Creatinine, Ser: 0.83 mg/dL (ref 0.44–1.00)
GFR calc Af Amer: >60 mL/min (ref >60)
GLUCOSE: 95 mg/dL (ref 65–99)
POTASSIUM: 4.2 mmol/L (ref 3.5–5.1)
Sodium: 141 mmol/L (ref 135–145)

## 2015-07-03 LAB — I-STAT BETA HCG BLOOD, ED (MC, WL, AP ONLY)

## 2015-07-03 MED ORDER — LACTATED RINGERS IV BOLUS (SEPSIS)
1000.0000 mL | Freq: Once | INTRAVENOUS | Status: AC
  Administered 2015-07-03: 1000 mL via INTRAVENOUS

## 2015-07-03 MED ORDER — ACETAMINOPHEN 325 MG PO TABS
650.0000 mg | ORAL_TABLET | Freq: Once | ORAL | Status: DC
Start: 2015-07-03 — End: 2015-07-03
  Filled 2015-07-03: qty 2

## 2015-07-03 MED ORDER — DIPHENHYDRAMINE HCL 50 MG/ML IJ SOLN
25.0000 mg | Freq: Once | INTRAMUSCULAR | Status: DC
  Filled 2015-07-03: qty 1

## 2015-07-03 MED ORDER — METOCLOPRAMIDE HCL 5 MG/ML IJ SOLN
10.0000 mg | Freq: Once | INTRAMUSCULAR | Status: DC
  Filled 2015-07-03: qty 2

## 2015-07-03 NOTE — ED Notes (Signed)
Patient arrives with complaint of loss of consciousness x4 episodes last night coupled with several episodes of possible absence seizures yesterday. History of POTS and seizures. Recently stopped taking seizure which she was on for several years without occurrence of seizures. Currently complaining mostly of headache behind her right eye, nausea. Noticeable contusion to right anterior head. Alert and oriented in triage. PERRL.

## 2015-07-03 NOTE — ED Notes (Signed)
Family to nurses station and sts pt keeps "Loosing time". 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

## 2015-07-03 NOTE — Discharge Instructions (Signed)
We saw you in the ER after you fainted. All of our cardiac workup is normal, including labs, EKG and chest X-RAY are normal. We are not sure what is causing your discomfort, but we feel comfortable sending you home at this time. We think POTs related fainting is most likely the case. The workup in the ER is not complete, and you should follow up with your primary care doctor, and in your case the cardiologist and neurologist as soon as possible.   Syncope Syncope is a medical term for fainting or passing out. This means you lose consciousness and drop to the ground. People are generally unconscious for less than 5 minutes. You may have some muscle twitches for up to 15 seconds before waking up and returning to normal. Syncope occurs more often in older adults, but it can happen to anyone. While most causes of syncope are not dangerous, syncope can be a sign of a serious medical problem. It is important to seek medical care.  CAUSES  Syncope is caused by a sudden drop in blood flow to the brain. The specific cause is often not determined. Factors that can bring on syncope include:  Taking medicines that lower blood pressure.  Sudden changes in posture, such as standing up quickly.  Taking more medicine than prescribed.  Standing in one place for too long.  Seizure disorders.  Dehydration and excessive exposure to heat.  Low blood sugar (hypoglycemia).  Straining to have a bowel movement.  Heart disease, irregular heartbeat, or other circulatory problems.  Fear, emotional distress, seeing blood, or severe pain. SYMPTOMS  Right before fainting, you may:  Feel dizzy or light-headed.  Feel nauseous.  See all white or all black in your field of vision.  Have cold, clammy skin. DIAGNOSIS  Your health care provider will ask about your symptoms, perform a physical exam, and perform an electrocardiogram (ECG) to record the electrical activity of your heart. Your health care provider  may also perform other heart or blood tests to determine the cause of your syncope which may include:  Transthoracic echocardiogram (TTE). During echocardiography, sound waves are used to evaluate how blood flows through your heart.  Transesophageal echocardiogram (TEE).  Cardiac monitoring. This allows your health care provider to monitor your heart rate and rhythm in real time.  Holter monitor. This is a portable device that records your heartbeat and can help diagnose heart arrhythmias. It allows your health care provider to track your heart activity for several days, if needed.  Stress tests by exercise or by giving medicine that makes the heart beat faster. TREATMENT  In most cases, no treatment is needed. Depending on the cause of your syncope, your health care provider may recommend changing or stopping some of your medicines. HOME CARE INSTRUCTIONS  Have someone stay with you until you feel stable.  Do not drive, use machinery, or play sports until your health care provider says it is okay.  Keep all follow-up appointments as directed by your health care provider.  Lie down right away if you start feeling like you might faint. Breathe deeply and steadily. Wait until all the symptoms have passed.  Drink enough fluids to keep your urine clear or pale yellow.  If you are taking blood pressure or heart medicine, get up slowly and take several minutes to sit and then stand. This can reduce dizziness. SEEK IMMEDIATE MEDICAL CARE IF:   You have a severe headache.  You have unusual pain in the chest, abdomen,  or back.  You are bleeding from your mouth or rectum, or you have black or tarry stool.  You have an irregular or very fast heartbeat.  You have pain with breathing.  You have repeated fainting or seizure-like jerking during an episode.  You faint when sitting or lying down.  You have confusion.  You have trouble walking.  You have severe weakness.  You have  vision problems. If you fainted, call your local emergency services (911 in [place] Do not drive yourself to the hospital.    This information is not intended to replace advice given to you by your health care provider. Make sure you discuss any questions you have with your health care provider.   Document Released: 04/01/2005 Document Revised: 08/16/2014 Document Reviewed: 05/31/2011 Elsevier Interactive Patient Education 2016 [employer]  Postural Orthostatic Tachycardia Syndrome Postural orthostatic tachycardia syndrome (POTS) is an increased heart rate when going from a lying (supine) position to a standing position. The heart rate may increase more than 30 beats per minute (BPM) above its resting rate when going from a lying to a standing position. POTS occurs more frequently in women than in men.  SYMPTOMS  POTS symptoms may be increased in the morning. Symptoms of POTS include:  Fainting or near fainting.  Inability to think clearly.  Extreme or chronic fatigue.  Exercise intolerance.  Chest pain.  Having the lower legs develop a reddish-blue color due to decreased blood flow (acrocyanosis). CAUSES POTS can be caused by different conditions. Sometimes, it has no known cause (idiopathic). Some causes of POTS include:  Viral illness.  Pregnancy.  Autoimmune diseases.  Medications.  Major surgery.  Trauma such as a car accident or major injury.  Medical conditions such as anemia, dehydration, and hyperthyroidism. DIAGNOSIS  POTS is diagnosed by:  Taking a complete history and physical exam.  Measuring the heart rate while lying and then upon standing.  Measuring blood pressure when going from a lying to a standing position. POTS is usually not associated with low blood pressure (orthostatic hypotension) when going from a lying to standing position. While standing, blood pressure should be taken 2, 5, and 10 minutes after getting up. TREATMENT  Treatment of POTS  depends upon the severity of the symptoms. Treatment includes:  Drinking plenty of fluids to avoid getting dehydrated.  Avoiding very hot environments to not get overheated.  Increasing your dietary salt intake as instructed by your caregiver.  Taking different types of medications as prescribed for POTS.  Avoiding some classes of medications such as vasodilators and diuretics. SEEK IMMEDIATE MEDICAL CARE IF  You have severe chest pain that does not go away. Call your local emergency service immediately.  You feel your heart racing or beating rapidly.  You feel like passing out.  You have very confused thinking. MAKE SURE YOU  Understand these instructions.  Will watch your condition.  Will get help right away if you are not doing well or get worse.   This information is not intended to replace advice given to you by your health care provider. Make sure you discuss any questions you have with your health care provider.   Document Released: 03/22/2002 Document Revised: 04/22/2014 Document Reviewed: 05/30/2010 Elsevier Interactive Patient Education [employer].

## 2015-07-03 NOTE — ED Notes (Signed)
Unsuccessful IV start 

## 2015-07-03 NOTE — ED Notes (Signed)
O2 is 100% after ambulating

## 2015-07-03 NOTE — ED Notes (Signed)
Pt having CP, EDP aware

## 2015-07-03 NOTE — ED Provider Notes (Addendum)
CSN: [id number]     Arrival date & time 07/03/15  [id number] History  By signing my name below, I, Irene Pap, attest that this documentation has been prepared under the direction and in the presence of Varney Biles, MD. Electronically Signed: Irene Pap, ED Scribe. 07/03/2015. 2:40 AM.   Chief Complaint  Patient presents with  . Loss of Consciousness   The history is provided by the patient. No language interpreter was used.  HPI Comments: Ciniyah Tella is a 30 y.o. Female with a hx of endometriosis and Potts Disease who presents to the Emergency Department complaining of LOC x3 one day ago. Pt was found face down in the bedroom doorway by a friend. She states that she first woke up on the porch, then had another episode where she woke up on the bathroom floor. Pt reports several episodes of possible absence seizures over the course of yesterday, but is not completely sure since her syncopal episodes have been unwitnessed. She states that her friend thought she had a seizure earlier in the ED because she was staring off into the distance. She reports associated chest pain and SOB earlier in the day that she attributed to anxiety, headache behind her right eye and nausea. Pt stopped taking Topamax 6 months ago that she had been on for several years without having a seizure. 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 Pt has taken Xanax today for her anxiety and does not take anything currently for her Pott's. Pt was followed by an electrocardiologist in [place], but has not established a new doctor since moving to the area. Pt states that her cousin died suddenly of heart attack when he was 32. She denies hx of blood clots, MI, DM, estrogen therapy, use of birth control, or other symptoms.   Past Medical History  Diagnosis Date  . Endometriosis   . Adenomyosis of uterus determined by biopsy   . Potts disease   . S/P endometrial ablation    History reviewed. No  pertinent past surgical history. History reviewed. No pertinent family history. Social History  Substance Use Topics  . Smoking status: Never Smoker   . Smokeless tobacco: None  . Alcohol Use: Yes     Comment: occasional   OB History    No data available     Review of Systems 10 Systems reviewed and all are negative for acute change except as noted in the HPI.  Allergies  Amoxicillin; Ketorolac; and Ibuprofen  Home Medications   Prior to Admission medications   Medication Sig Start Date End Date Taking? Authorizing Provider  ALPRAZolam Duanne Moron) 0.5 MG tablet Take 0.5 mg by mouth 3 (three) times daily as needed for anxiety.    Yes Historical Provider, MD  amphetamine-dextroamphetamine (ADDERALL) 5 MG tablet Take 60 mg by mouth daily as needed (thought stabilizer).    Yes Historical Provider, MD  B Complex Vitamins (VITAMIN-B COMPLEX) TABS Take 1 tablet by mouth daily.   Yes Historical Provider, MD  BIOTIN PO Take 1 tablet by mouth daily.   Yes Historical Provider, MD  cetirizine (ZYRTEC) 10 MG tablet Take 10 mg by mouth daily.   Yes Historical Provider, MD  Cholecalciferol (PA VITAMIN D-3) 2000 UNITS CAPS Take 1 tablet by mouth daily.   Yes Historical Provider, MD  ferrous sulfate 325 (65 FE) MG tablet Take 325 mg by mouth every other day.   Yes Historical Provider, MD  Ginkgo Biloba 500 MG CAPS Take 1  tablet by mouth daily.   Yes Historical Provider, MD  Magnesium 250 MG TABS Take 500 mg by mouth every other day.   Yes Historical Provider, MD  niacin (NIASPAN) 1000 MG CR tablet Take 1,000 mg by mouth daily.   Yes Historical Provider, MD   BP 118/74 mmHg  Pulse 67  Temp(Src) 98.4 F (36.9 C) (Oral)  Resp 17  Ht 5\' 2"  (1.575 m)  Wt 212 lb (96.163 kg)  BMI 38.77 kg/m2  SpO2 100%  LMP 07/03/2015 (Exact Date) Physical Exam  Constitutional: She is oriented to person, place, and time. She appears well-developed and well-nourished.  HENT:  Head: Normocephalic and atraumatic.   Eyes: EOM are normal. Pupils are equal, round, and reactive to light.  Neck: Normal range of motion. Neck supple.  Cardiovascular: Normal rate, regular rhythm and normal heart sounds.  Exam reveals no gallop and no friction rub.   No murmur heard. Pulmonary/Chest: Effort normal. She has no wheezes. She has no rhonchi. She has no rales.  Abdominal: Soft. There is no tenderness.  Musculoskeletal: Normal range of motion.  Neurological: She is alert and oriented to person, place, and time.  Skin: Skin is warm and dry.  Psychiatric: She has a normal mood and affect. Her behavior is normal.  Nursing note and vitals reviewed.   ED Course  Procedures (including critical care time) DIAGNOSTIC STUDIES: Oxygen Saturation is 98% on RA, normal by my interpretation.    COORDINATION OF CARE: 2:39 AM-Discussed treatment plan which includes continued cardiac monitoring with pt at bedside and pt agreed to plan.    Labs Review Labs Reviewed  BASIC METABOLIC PANEL  CBC  I-STAT [place], ED  I-STAT BETA HCG BLOOD, ED ([facility], [facility], [facility] ONLY)    Imaging Review Ct Head Wo Contrast  07/03/2015  CLINICAL DATA:  Loss of consciousness x4 episodes last night. Possible seizures yesterday. History of seizures recently stopped medications. Headache behind the right eye and nausea. EXAM: CT HEAD WITHOUT CONTRAST CT CERVICAL SPINE WITHOUT CONTRAST TECHNIQUE: Multidetector CT imaging of the head and cervical spine was performed following the standard protocol without intravenous contrast. Multiplanar CT image reconstructions of the cervical spine were also generated. COMPARISON:  None. FINDINGS: CT HEAD FINDINGS Ventricles and sulci appear symmetrical. No ventricular dilatation. No mass effect or midline shift. No abnormal extra-axial fluid collections. Gray-white matter junctions are distinct. Basal cisterns are not effaced. No evidence of acute intracranial hemorrhage. No depressed skull fractures. Visualized paranasal  sinuses and mastoid air cells are not opacified. CT CERVICAL SPINE FINDINGS Reversal of the usual cervical lordosis. This may be due to patient positioning but ligamentous injury or muscle spasm could also have this appearance and are not excluded. Slight anterior subluxation at C4-5 and C5-6 could also indicate ligamentous injury. Correlation physical examination is suggested. No vertebral compression deformities. No prevertebral soft tissue swelling. Normal alignment of the facet joints. C1-2 articulation appears intact. No focal bone lesion or bone destruction. Bone cortex appears intact. Soft tissues are unremarkable. IMPRESSION: No acute intracranial abnormalities. Nonspecific reversal of the usual cervical lordosis. Slight anterior subluxation at C4-5 and C5-6. Ligamentous injury not excluded. No acute displaced fractures identified. Electronically Signed   By: Lucienne Capers M.D.   On: 07/03/2015 02:35   Ct Cervical Spine Wo Contrast  07/03/2015  CLINICAL DATA:  Loss of consciousness x4 episodes last night. Possible seizures yesterday. History of seizures recently stopped medications. Headache behind the right eye and nausea. EXAM: CT HEAD WITHOUT CONTRAST  CT CERVICAL SPINE WITHOUT CONTRAST TECHNIQUE: Multidetector CT imaging of the head and cervical spine was performed following the standard protocol without intravenous contrast. Multiplanar CT image reconstructions of the cervical spine were also generated. COMPARISON:  None. FINDINGS: CT HEAD FINDINGS Ventricles and sulci appear symmetrical. No ventricular dilatation. No mass effect or midline shift. No abnormal extra-axial fluid collections. Gray-white matter junctions are distinct. Basal cisterns are not effaced. No evidence of acute intracranial hemorrhage. No depressed skull fractures. Visualized paranasal sinuses and mastoid air cells are not opacified. CT CERVICAL SPINE FINDINGS Reversal of the usual cervical lordosis. This may be due to patient  positioning but ligamentous injury or muscle spasm could also have this appearance and are not excluded. Slight anterior subluxation at C4-5 and C5-6 could also indicate ligamentous injury. Correlation physical examination is suggested. No vertebral compression deformities. No prevertebral soft tissue swelling. Normal alignment of the facet joints. C1-2 articulation appears intact. No focal bone lesion or bone destruction. Bone cortex appears intact. Soft tissues are unremarkable. IMPRESSION: No acute intracranial abnormalities. Nonspecific reversal of the usual cervical lordosis. Slight anterior subluxation at C4-5 and C5-6. Ligamentous injury not excluded. No acute displaced fractures identified. Electronically Signed   By: Lucienne Capers M.D.   On: 07/03/2015 02:35   I have personally reviewed and evaluated these images and lab results as part of my medical decision-making.   EKG Interpretation   Date/Time:  Monday July 03 2015 00:53:58 EDT Ventricular Rate:  70 PR Interval:  110 QRS Duration: 82 QT Interval:  376 QTC Calculation: 406 R Axis:   76 Text Interpretation:  Sinus rhythm with sinus arrhythmia with short PR  Otherwise normal ECG No acute changes No significant change since last  tracing Confirmed by Kathrynn Humble, MD, Mckyle Solanki [phone]) on 07/03/2015 2:31:00 AM      EKG Interpretation  Date/Time:  Monday July 03 2015 04:45:51 EDT Ventricular Rate:  69 PR Interval:  123 QRS Duration: 91 QT Interval:  383 QTC Calculation: 410 R Axis:   85 Text Interpretation:  Sinus rhythm normal axis normal intervals No acute changes Confirmed by Dinh Ayotte, MD, Kenzey Birkland ([id number]) on 07/03/2015 5:15:49 AM       MDM   Final diagnoses:  POTS (postural orthostatic tachycardia syndrome)  Syncope and collapse    I personally performed the services described in this documentation, which was scribed in my presence. The recorded information has been reviewed and is accurate.  Pt comes in with cc of  LOC. Hx of POTTs and absent seizures. It seems like she had multiple fainting episodes today. Currently  - she has normal vitals, normal ekg and normal cardio vascular and pulm exam. We will monitor for 4 hours for the syncope over the tele - since she had 3 episodes, check orthostatics, hydrate and do ambulatory pulsox. She has no cardiac hx and no PE risk factors, and is PERC neg.    Varney Biles, MD 07/03/15 [phone]  @[phone]  Pt complains of L sided chest pain, described as tightness. Lung exam is clear. STILL: Pt has no hx of PE, DVT and denies any exogenous estrogen use, long distance travels or surgery in the past 6 weeks, active cancer, recent immobilization. VSS and WNL. Repeat EKG is unremarkable. Will d/c. She has been advised to see neurologist and cardiologist - she used to see them in [place] before she moved here. [place] driving restrictions discussed.   Varney Biles, MD 07/03/15 [phone]

## 2015-07-03 NOTE — ED Notes (Signed)
EDP at bedside  

## 2015-07-03 NOTE — ED Notes (Signed)
MD at bedside. 

## 2015-08-13 DIAGNOSIS — Z880 Allergy status to penicillin: Secondary | ICD-10-CM | POA: Diagnosis not present

## 2015-08-13 DIAGNOSIS — Z79899 Other long term (current) drug therapy: Secondary | ICD-10-CM | POA: Insufficient documentation

## 2015-08-13 DIAGNOSIS — Z8619 Personal history of other infectious and parasitic diseases: Secondary | ICD-10-CM | POA: Diagnosis not present

## 2015-08-13 DIAGNOSIS — R21 Rash and other nonspecific skin eruption: Secondary | ICD-10-CM | POA: Diagnosis present

## 2015-08-13 DIAGNOSIS — L858 Other specified epidermal thickening: Secondary | ICD-10-CM | POA: Diagnosis not present

## 2015-08-13 DIAGNOSIS — Z8742 Personal history of other diseases of the female genital tract: Secondary | ICD-10-CM | POA: Diagnosis not present

## 2015-08-14 ENCOUNTER — Emergency Department (HOSPITAL_COMMUNITY)
Admission: EM | Admit: 2015-08-14 | Discharge: 2015-08-14 | Disposition: A | Discharge status: Home / Self Care | Payer: BLUE CROSS/BLUE SHIELD | Attending: Emergency Medicine | Admitting: Emergency Medicine

## 2015-08-14 ENCOUNTER — Encounter (HOSPITAL_COMMUNITY): Payer: Self-pay | Admitting: *Deleted

## 2015-08-14 DIAGNOSIS — L858 Other specified epidermal thickening: Secondary | ICD-10-CM

## 2015-08-14 MED ORDER — ACETAMINOPHEN 500 MG PO TABS
1000.0000 mg | ORAL_TABLET | Freq: Once | ORAL | Status: DC
  Filled 2015-08-14: qty 2

## 2015-08-14 NOTE — ED Notes (Signed)
Patient presents with full body rash that is now beginning to hurt  Denies any new foods, detergent, clothing, etc  Has been taking Benadryl and Visteril 25mg  once

## 2015-08-14 NOTE — ED Provider Notes (Signed)
CSN: [id number]     Arrival date & time 08/13/15  2346 History  By signing my name below, I, Irene Pap, attest that this documentation has been prepared under the direction and in the presence of Kimara Bencomo, MD. Electronically Signed: Irene Pap, ED Scribe. 08/14/2015. 12:52 AM.   Chief Complaint  Patient presents with  . Rash   Patient is a 30 y.o. female presenting with rash. The history is provided by the patient. No language interpreter was used.  Rash Location:  Full body Quality: not blistering   Severity:  Mild Onset quality:  Gradual Duration:  1 day Timing:  Constant Progression:  Worsening Chronicity:  New Context: not food and not new detergent/soap   Relieved by:  Nothing Worsened by:  Nothing tried Ineffective treatments:  Antihistamines Associated symptoms: no fever, no shortness of breath and no sore throat   HPI Comments: Chavi Ballardo is a 30 y.o. 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 Pt states that the itching has been causing bruising to the skin and that "my whole body is hurting." She reports mild relief with Benadryl and no relief with Atarax. Pt takes Zyrtec 2x daily. She denies fever, chills, nausea, vomiting, sore throat, trouble swallowing, or SOB.   Past Medical History  Diagnosis Date  . Endometriosis   . Adenomyosis of uterus determined by biopsy   . Potts disease   . S/P endometrial ablation    History reviewed. No pertinent past surgical history. No family history on file. Social History  Substance Use Topics  . Smoking status: Never Smoker   . Smokeless tobacco: Never Used  . Alcohol Use: Yes     Comment: occasional   OB History    No data available     Review of Systems  Constitutional: Negative for fever and chills.  HENT: Negative for sore throat and trouble swallowing.   Respiratory: Negative for shortness of  breath.   Skin: Positive for rash.  All other systems reviewed and are negative.  Allergies  Amoxicillin; Ketorolac; and Ibuprofen  Home Medications   Prior to Admission medications   Medication Sig Start Date End Date Taking? Authorizing Provider  ALPRAZolam Duanne Moron) 0.5 MG tablet Take 0.5 mg by mouth 3 (three) times daily as needed for anxiety.     Historical Provider, MD  amphetamine-dextroamphetamine (ADDERALL) 5 MG tablet Take 60 mg by mouth daily as needed (thought stabilizer).     Historical Provider, MD  B Complex Vitamins (VITAMIN-B COMPLEX) TABS Take 1 tablet by mouth daily.    Historical Provider, MD  BIOTIN PO Take 1 tablet by mouth daily.    Historical Provider, MD  cetirizine (ZYRTEC) 10 MG tablet Take 10 mg by mouth daily.    Historical Provider, MD  Cholecalciferol (PA VITAMIN D-3) 2000 UNITS CAPS Take 1 tablet by mouth daily.    Historical Provider, MD  ferrous sulfate 325 (65 FE) MG tablet Take 325 mg by mouth every other day.    Historical Provider, MD  Ginkgo Biloba 500 MG CAPS Take 1 tablet by mouth daily.    Historical Provider, MD  Magnesium 250 MG TABS Take 500 mg by mouth every other day.    Historical Provider, MD  niacin (NIASPAN) 1000 MG CR tablet Take 1,000 mg by mouth daily.    Historical Provider, MD   BP 120/68 mmHg  Pulse 56  Temp(Src) 98.6  F (37 C) (Oral)  Resp 16  SpO2 98%  LMP 08/10/2015 Physical Exam  Constitutional: She is oriented to person, place, and time. She appears well-developed and well-nourished. No distress.  HENT:  Head: Normocephalic and atraumatic.  Mouth/Throat: Oropharynx is clear and moist. No oropharyngeal exudate.  Trachea midline  Eyes: Conjunctivae and EOM are normal. Pupils are equal, round, and reactive to light.  Neck: Trachea normal and normal range of motion. Neck supple. No JVD present. Carotid bruit is not present.  Cardiovascular: Normal rate and regular rhythm.  Exam reveals no gallop and no friction rub.   No  murmur heard. Pulmonary/Chest: Effort normal and breath sounds normal. No stridor. She has no wheezes. She has no rales.  Abdominal: Soft. Bowel sounds are normal. She exhibits no mass. There is no tenderness. There is no rebound and no guarding.  Musculoskeletal: Normal range of motion.  Intact DP pulses  Lymphadenopathy:    She has no cervical adenopathy.  Neurological: She is alert and oriented to person, place, and time. She has normal reflexes. No cranial nerve deficit. She exhibits normal muscle tone. Coordination normal.  Cranial nerves 2-12 intact  Skin: Skin is warm and dry. She is not diaphoretic.  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 Her behavior is normal.  Nursing note and vitals reviewed.   ED Course  Procedures (including critical care time) DIAGNOSTIC STUDIES: Oxygen Saturation is 98% on RA, normal by my interpretation.    COORDINATION OF CARE: 12:48 AM-Discussed treatment plan which includes conservative care with Eucerin cream and exfoliation of skin with pt at bedside and pt agreed to plan.    Labs Review Labs Reviewed - No data to display  Imaging Review No results found.   EKG Interpretation None      MDM   Filed Vitals:   08/13/15 2354  BP: 120/68  Pulse: 56  Temp: 98.6 F (37 C)  Resp: 16   Medications - No data to display Results for orders placed or performed during the hospital encounter of [id number]  Basic metabolic panel  Result Value Ref Range   Sodium 141 135 - 145 mmol/L   Potassium 4.2 3.5 - 5.1 mmol/L   Chloride 104 101 - 111 mmol/L   CO2 25 22 - 32 mmol/L   Glucose, Bld 95 65 - 99 mg/dL   BUN 9 6 - 20 mg/dL   Creatinine, Ser 0.83 0.44 - 1.00 mg/dL   Calcium 9.3 8.9 - 10.3 mg/dL   GFR calc non Af Amer >60 >60 mL/min   GFR calc Af Amer >60 >60 mL/min   Anion gap 12 5 - 15  CBC  Result Value Ref Range   WBC 5.7 4.0 - 10.5 K/uL   RBC 4.34 3.87 - 5.11 MIL/uL    Hemoglobin 12.4 12.0 - 15.0 g/dL   HCT 38.0 36.0 - 46.0 %   MCV 87.6 78.0 - 100.0 fL   MCH 28.6 26.0 - 34.0 pg   MCHC 32.6 30.0 - 36.0 g/dL   RDW 13.6 11.5 - 15.5 %   Platelets 259 150 - 400 K/uL  I-stat troponin, ED (not at [facility], [facility])  Result Value Ref Range   Troponin i, poc 0.00 0.00 - 0.08 ng/mL   Comment 3          I-Stat beta hCG blood, ED ([facility], [facility], [facility] only)  Result Value Ref Range   I-stat hCG, quantitative <5.0 <  5 mIU/mL   Comment 3           No results found.   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  Exfoliation and hydration of the skin and close follow up with your PMD  I personally performed the services described in this documentation, which was scribed in my presence. The recorded information has been reviewed and is accurate.      Veatrice Kells, MD 08/14/15 [phone]

## 2015-08-15 ENCOUNTER — Ambulatory Visit (INDEPENDENT_AMBULATORY_CARE_PROVIDER_SITE_OTHER): Payer: BLUE CROSS/BLUE SHIELD | Admitting: Family Medicine

## 2015-08-15 ENCOUNTER — Ambulatory Visit (INDEPENDENT_AMBULATORY_CARE_PROVIDER_SITE_OTHER): Payer: BLUE CROSS/BLUE SHIELD

## 2015-08-15 VITALS — BP 126/80 | HR 86 | Temp 97.8°F | Resp 16 | Ht 62.0 in | Wt 214.0 lb

## 2015-08-15 DIAGNOSIS — G90A Postural orthostatic tachycardia syndrome (POTS): Secondary | ICD-10-CM

## 2015-08-15 DIAGNOSIS — M791 Myalgia: Secondary | ICD-10-CM

## 2015-08-15 DIAGNOSIS — R21 Rash and other nonspecific skin eruption: Secondary | ICD-10-CM

## 2015-08-15 DIAGNOSIS — R0789 Other chest pain: Secondary | ICD-10-CM

## 2015-08-15 DIAGNOSIS — R000 Tachycardia, unspecified: Secondary | ICD-10-CM | POA: Diagnosis not present

## 2015-08-15 DIAGNOSIS — R0602 Shortness of breath: Secondary | ICD-10-CM

## 2015-08-15 DIAGNOSIS — N3090 Cystitis, unspecified without hematuria: Secondary | ICD-10-CM

## 2015-08-15 DIAGNOSIS — I951 Orthostatic hypotension: Secondary | ICD-10-CM

## 2015-08-15 DIAGNOSIS — R5383 Other fatigue: Secondary | ICD-10-CM

## 2015-08-15 DIAGNOSIS — M609 Myositis, unspecified: Secondary | ICD-10-CM

## 2015-08-15 DIAGNOSIS — B09 Unspecified viral infection characterized by skin and mucous membrane lesions: Secondary | ICD-10-CM

## 2015-08-15 DIAGNOSIS — I498 Other specified cardiac arrhythmias: Secondary | ICD-10-CM

## 2015-08-15 DIAGNOSIS — IMO0001 Reserved for inherently not codable concepts without codable children: Secondary | ICD-10-CM

## 2015-08-15 LAB — POCT CBC
Granulocyte percent: 74.5 %G (ref 37–80)
HEMATOCRIT: 38.9 % (ref 37.7–47.9)
HEMOGLOBIN: 13.4 g/dL (ref 12.2–16.2)
LYMPH, POC: 4.8 — AB (ref 0.6–3.4)
MCH, POC: 29.4 pg (ref 27–31.2)
MCHC: 34.4 g/dL (ref 31.8–35.4)
MCV: 85.6 fL (ref 80–97)
MID (cbc): 0.3 (ref 0–0.9)
MPV: 8.2 fL (ref 0–99.8)
PLATELET COUNT, POC: 227 10*3/uL (ref 142–424)
POC GRANULOCYTE: 4.8 (ref 2–6.9)
POC LYMPH %: 20.7 % (ref 10–50)
POC MID %: 74.5 %M — AB (ref 0–12)
RBC: 4.55 M/uL (ref 4.04–5.48)
RDW, POC: 14.1 %
WBC: 6.4 10*3/uL (ref 4.6–10.2)

## 2015-08-15 LAB — COMPREHENSIVE METABOLIC PANEL
ALT: 16 U/L (ref 6–29)
AST: 19 U/L (ref 10–30)
Albumin: 4.1 g/dL (ref 3.6–5.1)
Alkaline Phosphatase: 68 U/L (ref 33–115)
BILIRUBIN TOTAL: 0.5 mg/dL (ref 0.2–1.2)
BUN: 7 mg/dL (ref 7–25)
CHLORIDE: 106 mmol/L (ref 98–110)
CO2: 21 mmol/L (ref 20–31)
CREATININE: 0.69 mg/dL (ref 0.50–1.10)
Calcium: 9.2 mg/dL (ref 8.6–10.2)
Glucose, Bld: 85 mg/dL (ref 65–99)
Potassium: 4.2 mmol/L (ref 3.5–5.3)
SODIUM: 139 mmol/L (ref 135–146)
TOTAL PROTEIN: 7 g/dL (ref 6.1–8.1)

## 2015-08-15 LAB — POCT URINALYSIS DIP (MANUAL ENTRY)
BILIRUBIN UA: NEGATIVE
Bilirubin, UA: NEGATIVE
GLUCOSE UA: NEGATIVE
Nitrite, UA: NEGATIVE
Protein Ur, POC: 30 — AB
SPEC GRAV UA: 1.015
Urobilinogen, UA: 1
pH, UA: 8.5

## 2015-08-15 LAB — POC MICROSCOPIC URINALYSIS (UMFC)

## 2015-08-15 MED ORDER — ALBUTEROL SULFATE HFA 108 (90 BASE) MCG/ACT IN AERS: 2.0000 | INHALATION_SPRAY | RESPIRATORY_TRACT | Status: DC | PRN

## 2015-08-15 MED ORDER — TRIAMCINOLONE ACETONIDE 0.1 % EX LOTN: 1.0000 "application " | TOPICAL_LOTION | Freq: Three times a day (TID) | CUTANEOUS | Status: DC

## 2015-08-15 MED ORDER — NITROFURANTOIN MONOHYD MACRO 100 MG PO CAPS: 100.0000 mg | ORAL_CAPSULE | Freq: Two times a day (BID) | ORAL | Status: DC

## 2015-08-15 NOTE — Addendum Note (Signed)
Addended by: HOPPER, DAVID H on: 08/15/2015 06:30 PM   Modules accepted: Orders

## 2015-08-15 NOTE — Progress Notes (Addendum)
Second visit same day today   Subjective: Patient is here for a repeat visit, secondary times a day. She went home and took a nap, she got up with chest pain and shortness of breath sensation. She says she knows it is not anxiety. She had taken the Xanax. She didn't want to go to the emergency room and came in here. She hurts in his tender in her upper chest wall.  Objective: Anxious appearing. Not cyanotic. Her neck is supple and nontender. Chest clear to auscultation. Heart regular without murmurs or gallops or arrhythmias. Chest wall is tender in the upper sternal region. Peak flow was 340 with a predicted of about 430.  O2 saturation 99% pulse 86.  Assessment: Chest pain, atypical Chest wall tenderness Shortness of breath Anxiety  Plan: EKG and chest x-ray  EKG normal  Chest x-ray normal. Radiology reading is still pending.  I explained to the patient that I feel like this is a chest wall pain, may be related to the other body pains she is having. I do not think it comes primarily from her lungs or heart. Her peak flow is a little bit low, but that may be because she is not blowing is hard due to the chest wall pain. Repeated take flow again and it was 310. She cannot take NSAIDs.   She can take the Tylenol at home. We will just have to await the additional labs. If she gets abruptly worse she will need to go to the emergency room even though she doesn't want to go there. Return here as needed. I will get her an albuterol inhaler to get if she is feeling short of breath.

## 2015-08-15 NOTE — Progress Notes (Signed)
Patient ID: Joanna Harris, female    DOB: Feb 03, 1986  Age: 30 y.o. MRN: [id number]  Chief Complaint  Patient presents with  . Chills  . Rash    Subjective:   Patient is been ill for the past few days. She developed a rash. It was on her face and upper arms. Then it has occurred more on the legs now in the other areas have faded. She has felt lousy. In the areas where she has lymph nodes she has been tender, but no large nodes were noted. She has had some dizziness and headache. Generalized malaise. Body aches. Temperature up to 101 yesterday. She went to the emergency room and was told she had keratitis pilaris or acne, to see a dermatologist if needed. She felt like she was sicker than that. She thought she had been ignored. She has not had any major GI symptoms. Last menstrual cycle was 2 weeks late, couple of weeks ago. She denies pregnancy.  She has a history of POTS.  She is concerned because that has been associated with some autoimmune processes and some people. She reads up on this, and realizes that that may not be a best thing for her.   Current allergies, medications, problem list, past/family and social histories reviewed.  Objective:  BP 126/80 mmHg  Pulse 81  Temp(Src) 97.8 F (36.6 C) (Oral)  Resp 16  Ht 5\' 2"  (1.575 m)  Wt 214 lb (97.07 kg)  BMI 39.13 kg/m2  SpO2 100%  LMP 08/10/2015  No major acute distress. She should refer those of the flushed rash on her face for a few days ago with a little puffy. She also has a keratosis. The Upper Arms at That Time. She Has Normal TMs. Throat Clear. Neck Supple without Major Nodes. Is Tender in the Anterior Neck. Chest Clear to Auscultation. Heart Regular without Murmurs. No Axillary Nodes. Mild Nodularity in the Groin Region. Abdomen Has Normal Bowel Sounds Soft without Organomegaly, Masses, or Tenderness. No Hepatosplenomegaly. No Rash on the Trunk. Minimal Keratitis on the Upper Arms. Has a Follicular/Keratitis. Keratitis  Appearance on the Medial Thighs and Upper Medial Calf Areas Primarily.  Assessment & Plan:   Assessment: 1. Rash and nonspecific skin eruption   2. Myalgia and myositis   3. Other fatigue   4. POTS (postural orthostatic tachycardia syndrome)   5. Cystitis   6. Viral rash       Plan: Check some labs on her. Someone will be sent out. We'll decide where we proceed from here.  Orders Placed This Encounter  Procedures  . Urine culture  . Comprehensive metabolic panel  . ANA  . POCT CBC  . POCT SEDIMENTATION RATE  . POCT urinalysis dipstick  . POCT Microscopic Urinalysis ([facility])    Meds ordered this encounter  Medications  . nitrofurantoin, macrocrystal-monohydrate, (MACROBID) 100 MG capsule    Sig: Take 1 capsule (100 mg total) by mouth 2 (two) times daily.    Dispense:  14 capsule    Refill:  0  . triamcinolone lotion (KENALOG) 0.1 %    Sig: Apply 1 application topically 3 (three) times daily.    Dispense:  60 mL    Refill:  0    Results for orders placed or performed in visit on 08/15/15  POCT CBC  Result Value Ref Range   WBC 6.4 4.6 - 10.2 K/uL   Lymph, poc 4.8 (A) 0.6 - 3.4   POC LYMPH PERCENT 20.7 10 - 50 %L  MID (cbc) 0.3 0 - 0.9   POC MID % 74.5 (A) 0 - 12 %M   POC Granulocyte 4.8 2 - 6.9   Granulocyte percent 74.5 37 - 80 %G   RBC 4.55 4.04 - 5.48 M/uL   Hemoglobin 13.4 12.2 - 16.2 g/dL   HCT, POC 38.9 37.7 - 47.9 %   MCV 85.6 80 - 97 fL   MCH, POC 29.4 27 - 31.2 pg   MCHC 34.4 31.8 - 35.4 g/dL   RDW, POC 14.1 %   Platelet Count, POC 227 142 - 424 K/uL   MPV 8.2 0 - 99.8 fL  POCT urinalysis dipstick  Result Value Ref Range   Color, UA yellow yellow   Clarity, UA clear clear   Glucose, UA negative negative   Bilirubin, UA negative negative   Ketones, POC UA negative negative   Spec Grav, UA 1.015    Blood, UA trace-lysed (A) negative   pH, UA 8.5    Protein Ur, POC =30 (A) negative   Urobilinogen, UA 1.0    Nitrite, UA Negative Negative    Leukocytes, UA small (1+) (A) Negative  POCT Microscopic Urinalysis ([facility])  Result Value Ref Range   WBC,UR,HPF,POC Many (A) None WBC/hpf   RBC,UR,HPF,POC Few (A) None RBC/hpf   Bacteria Few (A) None, Too numerous to count   Mucus Present (A) Absent   Epithelial Cells, UR Per Microscopy Moderate (A) None, Too numerous to count cells/hpf   Urine is consistent with viral illness. With her health history we will check a few other things. The rash looks like keratitis pilaris, gave some triamcinolone cream to use on it.with UTI. Her symptoms sounded more like a keratitis pilaris. Gave some cortisone cream to use on it. Nothing really ties together well, and if she is not doing better she will need to return.      Patient Instructions   Take the nitrofurantoin one twice daily for urinary infection  Drink plenty of fluids  Use the triamcinolone lotion twice daily on the rash  Return if not improving  I will let you know the results of your additional labs in a few days  Tylenol 1000 mg 3 times daily as needed for pain or aleve 2 pills twice daily  Zyrtec  One daily as needed for itching      IF you received an x-ray today, you will receive an invoice from [facility]. Please contact [facility] at [phone] with questions or concerns regarding your invoice.   IF you received labwork today, you will receive an invoice from [employer]. Please contact [facility] at [phone] with questions or concerns regarding your invoice.   Our billing staff will not be able to assist you with questions regarding bills from these companies.  You will be contacted with the lab results as soon as they are available. The fastest way to get your results is to activate your My Chart account. Instructions are located on the last page of this paperwork. If you have not heard from [place] regarding the results in 2 weeks, please contact this office.           Return if symptoms worsen or fail to improve.   Kelbi Renstrom, MD 08/15/2015

## 2015-08-15 NOTE — Addendum Note (Signed)
Addended by: Carter Kitten on: 08/15/2015 06:30 PM   Modules accepted: Orders

## 2015-08-15 NOTE — Patient Instructions (Addendum)
Take the nitrofurantoin one twice daily for urinary infection  Drink plenty of fluids  Use the triamcinolone lotion twice daily on the rash  Return if not improving  I will let you know the results of your additional labs in a few days  Tylenol 1000 mg 3 times daily as needed for pain or aleve 2 pills twice daily  Zyrtec  One daily as needed for itching      IF you received an x-ray today, you will receive an invoice from [facility]. Please contact [facility] at [phone] with questions or concerns regarding your invoice.   IF you received labwork today, you will receive an invoice from [employer]. Please contact [facility] at [phone] with questions or concerns regarding your invoice.   Our billing staff will not be able to assist you with questions regarding bills from these companies.  You will be contacted with the lab results as soon as they are available. The fastest way to get your results is to activate your My Chart account. Instructions are located on the last page of this paperwork. If you have not heard from [place] regarding the results in 2 weeks, please contact this office.

## 2015-08-15 NOTE — Addendum Note (Signed)
Addended by: Shirline Kendle H on: 08/15/2015 06:39 PM   Modules accepted: Orders, Level of Service

## 2015-08-16 ENCOUNTER — Telehealth: Payer: Self-pay

## 2015-08-16 LAB — URINE CULTURE

## 2015-08-16 LAB — ANA: Anti Nuclear Antibody(ANA): POSITIVE — AB

## 2015-08-16 LAB — ANTI-NUCLEAR AB-TITER (ANA TITER)

## 2015-08-16 NOTE — Telephone Encounter (Signed)
Patient is calling to request a ruematologist referral to [facility] with Dr. Dossie Der. She states they need they needs ov notes, labs work, and a note statinting that this referral is to rule out any ruematology issues. Patient phone: [phone]

## 2015-08-17 ENCOUNTER — Other Ambulatory Visit: Payer: Self-pay | Admitting: Family Medicine

## 2015-08-17 DIAGNOSIS — R899 Unspecified abnormal finding in specimens from other organs, systems and tissues: Secondary | ICD-10-CM

## 2015-08-17 DIAGNOSIS — M2550 Pain in unspecified joint: Secondary | ICD-10-CM

## 2015-08-17 NOTE — Telephone Encounter (Signed)
Call patient: Joanna Harris was only very weakly positive, and unlikely to be related to any rheumatologic disease.  However with her anxiety about this I will make a referral.  This is not an urgent issue, and encourage her not to worry excessively about this while awaiting an appointment which often takes some time to get.

## 2015-08-17 NOTE — Telephone Encounter (Signed)
Dr Hopper 

## 2015-08-18 NOTE — Telephone Encounter (Signed)
The referral has been faxed over to Dr Audelia Hives office.

## 2015-08-18 NOTE — Telephone Encounter (Signed)
SPoke with pt, she states Dr. Dossie Der office states they can get her in next week if we send the referral.

## 2015-08-18 NOTE — Telephone Encounter (Signed)
Left message for pt to call back  °

## 2015-09-16 ENCOUNTER — Emergency Department (HOSPITAL_COMMUNITY)
Admission: EM | Admit: 2015-09-16 | Discharge: 2015-09-17 | Disposition: A | Discharge status: Home / Self Care | Payer: BLUE CROSS/BLUE SHIELD | Attending: Emergency Medicine | Admitting: Emergency Medicine

## 2015-09-16 ENCOUNTER — Encounter (HOSPITAL_COMMUNITY): Payer: Self-pay | Admitting: Emergency Medicine

## 2015-09-16 DIAGNOSIS — Z880 Allergy status to penicillin: Secondary | ICD-10-CM | POA: Insufficient documentation

## 2015-09-16 DIAGNOSIS — Z79899 Other long term (current) drug therapy: Secondary | ICD-10-CM | POA: Insufficient documentation

## 2015-09-16 DIAGNOSIS — Z792 Long term (current) use of antibiotics: Secondary | ICD-10-CM | POA: Insufficient documentation

## 2015-09-16 DIAGNOSIS — R52 Pain, unspecified: Secondary | ICD-10-CM | POA: Insufficient documentation

## 2015-09-16 DIAGNOSIS — Z8742 Personal history of other diseases of the female genital tract: Secondary | ICD-10-CM | POA: Insufficient documentation

## 2015-09-16 DIAGNOSIS — Z8619 Personal history of other infectious and parasitic diseases: Secondary | ICD-10-CM | POA: Insufficient documentation

## 2015-09-16 DIAGNOSIS — Z7952 Long term (current) use of systemic steroids: Secondary | ICD-10-CM | POA: Insufficient documentation

## 2015-09-16 NOTE — ED Notes (Signed)
Pt c/o joint pain in her arms and legs,  St's her legs have been  Swollen.  Has taken her home meds without relief

## 2015-09-17 MED ORDER — HYDROCODONE-ACETAMINOPHEN 5-325 MG PO TABS
1.0000 | ORAL_TABLET | Freq: Once | ORAL | Status: AC
  Administered 2015-09-17: 1 via ORAL
  Filled 2015-09-17: qty 1

## 2015-09-17 MED ORDER — PREDNISONE 20 MG PO TABS: 40.0000 mg | ORAL_TABLET | Freq: Every day | ORAL | Status: DC

## 2015-09-17 MED ORDER — PREDNISONE 20 MG PO TABS
60.0000 mg | ORAL_TABLET | ORAL | Status: AC
  Administered 2015-09-17: 60 mg via ORAL
  Filled 2015-09-17: qty 3

## 2015-09-17 MED ORDER — HYDROCODONE-ACETAMINOPHEN 5-325 MG PO TABS: 1.0000 | ORAL_TABLET | Freq: Three times a day (TID) | ORAL | Status: DC | PRN

## 2015-09-17 NOTE — ED Provider Notes (Signed)
CSN: [id number]     Arrival date & time 09/16/15  2204 History   First MD Initiated Contact with Patient 09/16/15 2337     Chief Complaint  Patient presents with  . Joint Pain     (Consider location/radiation/quality/duration/timing/severity/associated sxs/prior Treatment) HPI Patient presents with concern of diffuse discomfort, and pain in multiple joints. Patient notes that she is currently being admitted for rheumatologic disease and has had multiple prior evaluations thus far. Today, patient developed diffuse myalgia, polyarthralgia and has had no improvement with meloxicam, Tylenol. No new different chest pain, no syncope, no nausea vomiting, fever. Patient spoke with the rheumatologist was referred here for evaluation.  Past Medical History  Diagnosis Date  . Endometriosis   . Adenomyosis of uterus determined by biopsy   . Potts disease   . S/P endometrial ablation    History reviewed. No pertinent past surgical history. No family history on file. Social History  Substance Use Topics  . Smoking status: Never Smoker   . Smokeless tobacco: Never Used  . Alcohol Use: Yes     Comment: occasional   OB History    No data available     Review of Systems  Constitutional: Positive for fatigue.       Per HPI, otherwise negative  HENT:       Per HPI, otherwise negative  Respiratory:       Per HPI, otherwise negative  Cardiovascular:       Per HPI, otherwise negative  Gastrointestinal: Negative for vomiting.  Endocrine:       Negative aside from HPI  Genitourinary:       Neg aside from HPI   Musculoskeletal:       Per HPI, otherwise negative  Skin: Negative.   Allergic/Immunologic:       Ongoing rheum workup  Neurological: Negative for syncope.      Allergies  Amoxicillin; Ketorolac; and Ibuprofen  Home Medications   Prior to Admission medications   Medication Sig Start Date End Date Taking? Authorizing Provider  albuterol (PROVENTIL HFA;VENTOLIN HFA) 108  (90 Base) MCG/ACT inhaler Inhale 2 puffs into the lungs every 4 (four) hours as needed for wheezing or shortness of breath (cough, shortness of breath or wheezing.). 08/15/15   Posey Boyer, MD  ALPRAZolam Duanne Moron) 0.5 MG tablet Take 0.5 mg by mouth 3 (three) times daily as needed for anxiety.     Historical Provider, MD  amphetamine-dextroamphetamine (ADDERALL) 5 MG tablet Take 60 mg by mouth daily as needed (thought stabilizer).     Historical Provider, MD  B Complex Vitamins (VITAMIN-B COMPLEX) TABS Take 1 tablet by mouth daily.    Historical Provider, MD  BIOTIN PO Take 1 tablet by mouth daily.    Historical Provider, MD  cetirizine (ZYRTEC) 10 MG tablet Take 10 mg by mouth daily.    Historical Provider, MD  Cholecalciferol (PA VITAMIN D-3) 2000 UNITS CAPS Take 1 tablet by mouth daily.    Historical Provider, MD  ferrous sulfate 325 (65 FE) MG tablet Take 325 mg by mouth every other day.    Historical Provider, MD  Ginkgo Biloba 500 MG CAPS Take 1 tablet by mouth daily.    Historical Provider, MD  HYDROcodone-acetaminophen (NORCO/VICODIN) 5-325 MG tablet Take 1 tablet by mouth 3 (three) times daily as needed for moderate pain. 09/17/15   Carmin Muskrat, MD  Magnesium 250 MG TABS Take 500 mg by mouth every other day.    Historical Provider, MD  niacin (NIASPAN)  1000 MG CR tablet Take 1,000 mg by mouth daily.    Historical Provider, MD  nitrofurantoin, macrocrystal-monohydrate, (MACROBID) 100 MG capsule Take 1 capsule (100 mg total) by mouth 2 (two) times daily. 08/15/15   Posey Boyer, MD  predniSONE (DELTASONE) 20 MG tablet Take 2 tablets (40 mg total) by mouth daily with breakfast. For the next four days 09/17/15   Carmin Muskrat, MD  triamcinolone lotion (KENALOG) 0.1 % Apply 1 application topically 3 (three) times daily. 08/15/15   Posey Boyer, MD   BP 113/71 mmHg  Pulse 66  Temp(Src) 98.5 F (36.9 C) (Oral)  Resp 16  Ht 5\' 3"  (1.6 m)  Wt 215 lb (97.523 kg)  BMI 38.09 kg/m2  SpO2 100%   LMP 09/16/2015 Physical Exam  Constitutional: She is oriented to person, place, and time. She appears well-developed and well-nourished. No distress.  HENT:  Head: Normocephalic and atraumatic.  Eyes: Conjunctivae and EOM are normal.  Cardiovascular: Normal rate and regular rhythm.   Pulmonary/Chest: Effort normal and breath sounds normal. No stridor. No respiratory distress.  Abdominal: She exhibits no distension.  Musculoskeletal: She exhibits no edema.  No discernible deformity, no appreciable swelling, nor edema. No tenderness to palpation.  Neurological: She is alert and oriented to person, place, and time. No cranial nerve deficit.  Skin: Skin is warm and dry.  Psychiatric: She has a normal mood and affect.  Nursing note and vitals reviewed.   ED Course  Procedures (including critical care time)   MDM   Final diagnoses:  Pain   Patient presents with concern of pain in multiple areas. Here she is awake, alert, hemodynamically stable, with no evidence for neurologic dysfunction, nor acute musculoskeletal changes. Patient has had very thorough rheumatologic evaluation, scheduled for next appointment in 4 days. With no evidence for acute changes, the patient started on a course of analgesia, steroids for likely rheumatologic flare. Patient discharged in stable condition.  Carmin Muskrat, MD 09/17/15 0010

## 2015-09-17 NOTE — ED Notes (Signed)
Pt verbalized understanding of d/c instrucitons and has no further questions. Pt stable and NAD. Pt d/c home with friend driving.

## 2015-09-17 NOTE — Discharge Instructions (Signed)
As discussed, your evaluation today has been largely reassuring.  But, it is important that you monitor your condition carefully, and do not hesitate to return to the ED if you develop new, or concerning changes in your condition. ? ?Otherwise, please follow-up with your physician for appropriate ongoing care. ? ?

## 2015-11-01 ENCOUNTER — Emergency Department (HOSPITAL_COMMUNITY): Payer: BLUE CROSS/BLUE SHIELD

## 2015-11-01 ENCOUNTER — Emergency Department (HOSPITAL_COMMUNITY)
Admission: EM | Admit: 2015-11-01 | Discharge: 2015-11-01 | Disposition: A | Discharge status: Home / Self Care | Payer: BLUE CROSS/BLUE SHIELD | Attending: Emergency Medicine | Admitting: Emergency Medicine

## 2015-11-01 ENCOUNTER — Encounter (HOSPITAL_COMMUNITY): Payer: Self-pay | Admitting: *Deleted

## 2015-11-01 DIAGNOSIS — R110 Nausea: Secondary | ICD-10-CM | POA: Insufficient documentation

## 2015-11-01 DIAGNOSIS — M545 Low back pain: Secondary | ICD-10-CM | POA: Diagnosis not present

## 2015-11-01 DIAGNOSIS — R51 Headache: Secondary | ICD-10-CM | POA: Insufficient documentation

## 2015-11-01 DIAGNOSIS — Y9389 Activity, other specified: Secondary | ICD-10-CM | POA: Diagnosis not present

## 2015-11-01 DIAGNOSIS — S8992XA Unspecified injury of left lower leg, initial encounter: Secondary | ICD-10-CM | POA: Insufficient documentation

## 2015-11-01 DIAGNOSIS — S069XAA Unspecified intracranial injury with loss of consciousness status unknown, initial encounter: Secondary | ICD-10-CM

## 2015-11-01 DIAGNOSIS — M542 Cervicalgia: Secondary | ICD-10-CM | POA: Diagnosis not present

## 2015-11-01 DIAGNOSIS — Y92410 Unspecified street and highway as the place of occurrence of the external cause: Secondary | ICD-10-CM | POA: Diagnosis not present

## 2015-11-01 DIAGNOSIS — S069X9A Unspecified intracranial injury with loss of consciousness of unspecified duration, initial encounter: Secondary | ICD-10-CM

## 2015-11-01 DIAGNOSIS — Z79899 Other long term (current) drug therapy: Secondary | ICD-10-CM | POA: Insufficient documentation

## 2015-11-01 DIAGNOSIS — Y999 Unspecified external cause status: Secondary | ICD-10-CM | POA: Diagnosis not present

## 2015-11-01 HISTORY — DX: Unspecified intracranial injury with loss of consciousness status unknown, initial encounter: S06.9XAA

## 2015-11-01 HISTORY — DX: Unspecified intracranial injury with loss of consciousness of unspecified duration, initial encounter: S06.9X9A

## 2015-11-01 MED ORDER — LIDOCAINE 5 % EX PTCH: 1.0000 | MEDICATED_PATCH | CUTANEOUS | Status: DC

## 2015-11-01 MED ORDER — METHOCARBAMOL 500 MG PO TABS
1000.0000 mg | ORAL_TABLET | Freq: Once | ORAL | Status: AC
  Administered 2015-11-01: 1000 mg via ORAL
  Filled 2015-11-01: qty 2

## 2015-11-01 MED ORDER — OXYCODONE-ACETAMINOPHEN 5-325 MG PO TABS: 1.0000 | ORAL_TABLET | Freq: Four times a day (QID) | ORAL | Status: DC | PRN

## 2015-11-01 MED ORDER — METHOCARBAMOL 500 MG PO TABS: 500.0000 mg | ORAL_TABLET | Freq: Two times a day (BID) | ORAL | Status: DC

## 2015-11-01 MED ORDER — OXYCODONE-ACETAMINOPHEN 5-325 MG PO TABS
2.0000 | ORAL_TABLET | Freq: Once | ORAL | Status: AC
  Administered 2015-11-01: 2 via ORAL
  Filled 2015-11-01: qty 2

## 2015-11-01 NOTE — ED Notes (Signed)
Pt given discharge instructions, verbalized understanding of need to follow up with PCP and orthopedist, reasons to return to the ED and medications to take at home. Pt denied further questions or concerns. Pt escorted to exit via wheelchair.

## 2015-11-01 NOTE — ED Notes (Addendum)
Ortho tech completing crutch education at this time.
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
## 2015-11-01 NOTE — ED Notes (Signed)
Pt to XR at this time

## 2015-11-03 ENCOUNTER — Encounter (HOSPITAL_COMMUNITY): Payer: Self-pay | Admitting: Emergency Medicine

## 2015-11-03 ENCOUNTER — Ambulatory Visit (HOSPITAL_COMMUNITY)
Admission: EM | Admit: 2015-11-03 | Discharge: 2015-11-03 | Disposition: A | Discharge status: Nursing Home (Includes ICF) | Payer: Self-pay | Attending: Family Medicine | Admitting: Family Medicine

## 2015-11-03 DIAGNOSIS — Y999 Unspecified external cause status: Secondary | ICD-10-CM | POA: Diagnosis not present

## 2015-11-03 DIAGNOSIS — Y939 Activity, unspecified: Secondary | ICD-10-CM | POA: Diagnosis not present

## 2015-11-03 DIAGNOSIS — H53149 Visual discomfort, unspecified: Secondary | ICD-10-CM

## 2015-11-03 DIAGNOSIS — R200 Anesthesia of skin: Secondary | ICD-10-CM

## 2015-11-03 DIAGNOSIS — S060X0A Concussion without loss of consciousness, initial encounter: Secondary | ICD-10-CM

## 2015-11-03 DIAGNOSIS — S0990XA Unspecified injury of head, initial encounter: Secondary | ICD-10-CM | POA: Diagnosis present

## 2015-11-03 DIAGNOSIS — Y92410 Unspecified street and highway as the place of occurrence of the external cause: Secondary | ICD-10-CM | POA: Diagnosis not present

## 2015-11-03 DIAGNOSIS — G44301 Post-traumatic headache, unspecified, intractable: Secondary | ICD-10-CM

## 2015-11-03 LAB — CBC WITH DIFFERENTIAL/PLATELET
BASOS PCT: 0 %
Basophils Absolute: 0 10*3/uL (ref 0.0–0.1)
EOS ABS: 0.2 10*3/uL (ref 0.0–0.7)
EOS PCT: 3 %
HCT: 39.2 % (ref 36.0–46.0)
HEMOGLOBIN: 12.7 g/dL (ref 12.0–15.0)
LYMPHS ABS: 2.2 10*3/uL (ref 0.7–4.0)
Lymphocytes Relative: 23 %
MCH: 28.2 pg (ref 26.0–34.0)
MCHC: 32.4 g/dL (ref 30.0–36.0)
MCV: 87.1 fL (ref 78.0–100.0)
MONO ABS: 0.4 10*3/uL (ref 0.1–1.0)
MONOS PCT: 5 %
NEUTROS PCT: 69 %
Neutro Abs: 6.7 10*3/uL (ref 1.7–7.7)
PLATELETS: 271 10*3/uL (ref 150–400)
RBC: 4.5 MIL/uL (ref 3.87–5.11)
RDW: 14 % (ref 11.5–15.5)
WBC: 9.5 10*3/uL (ref 4.0–10.5)

## 2015-11-03 LAB — COMPREHENSIVE METABOLIC PANEL
ALBUMIN: 3.9 g/dL (ref 3.5–5.0)
ALT: 19 U/L (ref 14–54)
ANION GAP: 6 (ref 5–15)
AST: 23 U/L (ref 15–41)
Alkaline Phosphatase: 55 U/L (ref 38–126)
BUN: 7 mg/dL (ref 6–20)
CO2: 22 mmol/L (ref 22–32)
CREATININE: 0.86 mg/dL (ref 0.44–1.00)
Calcium: 9.6 mg/dL (ref 8.9–10.3)
Chloride: 110 mmol/L (ref 101–111)
GFR calc non Af Amer: >60 mL/min (ref >60)
Glucose, Bld: 97 mg/dL (ref 65–99)
POTASSIUM: 3.6 mmol/L (ref 3.5–5.1)
SODIUM: 138 mmol/L (ref 135–145)
TOTAL PROTEIN: 7 g/dL (ref 6.5–8.1)
Total Bilirubin: 0.4 mg/dL (ref 0.3–1.2)

## 2015-11-03 LAB — URINALYSIS, ROUTINE W REFLEX MICROSCOPIC
Glucose, UA: NEGATIVE mg/dL
Ketones, ur: 15 mg/dL — AB
Nitrite: NEGATIVE
Protein, ur: NEGATIVE mg/dL
Specific Gravity, Urine: 1.028 (ref 1.005–1.030)
pH: 5.5 (ref 5.0–8.0)

## 2015-11-03 LAB — URINE MICROSCOPIC-ADD ON

## 2015-11-03 LAB — POC URINE PREG, ED: Preg Test, Ur: NEGATIVE
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
## 2015-11-03 NOTE — ED Notes (Signed)
Patient report called to Santiago Glad RN at [facility]
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
## 2015-11-04 ENCOUNTER — Emergency Department (HOSPITAL_COMMUNITY)
Admission: EM | Admit: 2015-11-04 | Discharge: 2015-11-04 | Disposition: A | Discharge status: Home / Self Care | Payer: BLUE CROSS/BLUE SHIELD | Attending: Emergency Medicine | Admitting: Emergency Medicine

## 2015-11-04 ENCOUNTER — Emergency Department (HOSPITAL_COMMUNITY): Payer: BLUE CROSS/BLUE SHIELD

## 2015-11-04 DIAGNOSIS — S060X0A Concussion without loss of consciousness, initial encounter: Secondary | ICD-10-CM

## 2015-11-04 DIAGNOSIS — R51 Headache: Secondary | ICD-10-CM

## 2015-11-04 DIAGNOSIS — R519 Headache, unspecified: Secondary | ICD-10-CM

## 2015-11-04 MED ORDER — DIPHENHYDRAMINE HCL 25 MG PO CAPS
25.0000 mg | ORAL_CAPSULE | Freq: Once | ORAL | Status: AC
  Administered 2015-11-04: 25 mg via ORAL
  Filled 2015-11-04: qty 1

## 2015-11-04 MED ORDER — METOCLOPRAMIDE HCL 5 MG/ML IJ SOLN
10.0000 mg | Freq: Once | INTRAMUSCULAR | Status: AC
  Administered 2015-11-04: 10 mg via INTRAMUSCULAR
  Filled 2015-11-04: qty 2
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
## 2015-11-04 NOTE — ED Notes (Signed)
Patient transported to CT 

## 2015-11-09 ENCOUNTER — Telehealth: Payer: Self-pay | Admitting: *Deleted

## 2016-01-08 ENCOUNTER — Other Ambulatory Visit (INDEPENDENT_AMBULATORY_CARE_PROVIDER_SITE_OTHER): Payer: BLUE CROSS/BLUE SHIELD

## 2016-01-08 ENCOUNTER — Encounter: Payer: Self-pay | Admitting: Neurology

## 2016-01-08 ENCOUNTER — Telehealth: Payer: Self-pay

## 2016-01-08 ENCOUNTER — Ambulatory Visit (INDEPENDENT_AMBULATORY_CARE_PROVIDER_SITE_OTHER): Payer: BLUE CROSS/BLUE SHIELD | Admitting: Neurology

## 2016-01-08 VITALS — BP 110/60 | HR 113 | Ht 62.0 in | Wt 217.0 lb

## 2016-01-08 DIAGNOSIS — R410 Disorientation, unspecified: Secondary | ICD-10-CM

## 2016-01-08 DIAGNOSIS — R413 Other amnesia: Secondary | ICD-10-CM

## 2016-01-08 DIAGNOSIS — F0781 Postconcussional syndrome: Secondary | ICD-10-CM

## 2016-01-08 DIAGNOSIS — F329 Major depressive disorder, single episode, unspecified: Secondary | ICD-10-CM

## 2016-01-08 DIAGNOSIS — R29810 Facial weakness: Secondary | ICD-10-CM | POA: Diagnosis not present

## 2016-01-08 DIAGNOSIS — F418 Other specified anxiety disorders: Secondary | ICD-10-CM

## 2016-01-08 DIAGNOSIS — F419 Anxiety disorder, unspecified: Secondary | ICD-10-CM

## 2016-01-08 LAB — VITAMIN B12: VITAMIN B 12: 386 pg/mL (ref 211–911)

## 2016-01-08 LAB — TSH: TSH: 2.19 u[IU]/mL (ref 0.35–4.50)

## 2016-01-08 NOTE — Telephone Encounter (Signed)
Results released via mychart. 

## 2016-01-08 NOTE — Telephone Encounter (Signed)
-----   Message from Pieter Partridge, DO sent at 01/08/2016  3:16 PM EDT ----- Labs look okay.
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
## 2016-01-08 NOTE — Progress Notes (Signed)
Chart forwarded to Dr. Kayleen Memos.

## 2016-01-08 NOTE — Patient Instructions (Signed)
1.  We will check MRI of brain without contrast 2.  We will refer you for formal neuropsychological testing 3.  Will check B12 and TSH 4.  Follow up after testing

## 2016-01-16 ENCOUNTER — Inpatient Hospital Stay: Admission: RE | Admit: 2016-01-16 | Payer: BLUE CROSS/BLUE SHIELD | Source: Ambulatory Visit

## 2016-01-21 ENCOUNTER — Other Ambulatory Visit: Payer: BLUE CROSS/BLUE SHIELD

## 2016-01-23 ENCOUNTER — Encounter: Payer: Self-pay | Admitting: Psychology

## 2016-01-23 ENCOUNTER — Ambulatory Visit (INDEPENDENT_AMBULATORY_CARE_PROVIDER_SITE_OTHER): Payer: BLUE CROSS/BLUE SHIELD | Admitting: Psychology

## 2016-01-23 DIAGNOSIS — F3162 Bipolar disorder, current episode mixed, moderate: Secondary | ICD-10-CM

## 2016-01-23 DIAGNOSIS — S060X0S Concussion without loss of consciousness, sequela: Secondary | ICD-10-CM | POA: Diagnosis not present

## 2016-01-23 DIAGNOSIS — F0781 Postconcussional syndrome: Secondary | ICD-10-CM
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
## 2016-02-18 ENCOUNTER — Ambulatory Visit
Admission: RE | Admit: 2016-02-18 | Discharge: 2016-02-18 | Disposition: A | Discharge status: Home / Self Care | Payer: BLUE CROSS/BLUE SHIELD | Source: Ambulatory Visit | Attending: Neurology | Admitting: Neurology

## 2016-02-18 DIAGNOSIS — R410 Disorientation, unspecified: Secondary | ICD-10-CM

## 2016-02-18 DIAGNOSIS — F0781 Postconcussional syndrome: Secondary | ICD-10-CM

## 2016-02-18 DIAGNOSIS — R29810 Facial weakness: Secondary | ICD-10-CM

## 2016-02-18 DIAGNOSIS — R413 Other amnesia: Secondary | ICD-10-CM

## 2016-02-19 ENCOUNTER — Telehealth: Payer: Self-pay

## 2016-02-19 NOTE — Telephone Encounter (Signed)
-----   Message from Pieter Partridge, DO sent at 02/19/2016 11:36 AM EST ----- MRI is normal

## 2016-02-19 NOTE — Telephone Encounter (Signed)
Sent Via mychart

## 2016-02-20 ENCOUNTER — Ambulatory Visit (INDEPENDENT_AMBULATORY_CARE_PROVIDER_SITE_OTHER): Payer: BLUE CROSS/BLUE SHIELD | Admitting: Psychology

## 2016-02-20 DIAGNOSIS — S060X0S Concussion without loss of consciousness, sequela: Secondary | ICD-10-CM | POA: Diagnosis not present

## 2016-02-20 DIAGNOSIS — R413 Other amnesia: Secondary | ICD-10-CM

## 2016-02-20 NOTE — Progress Notes (Signed)
   Neuropsychology Note  [name] returned today for 3 hours of neuropsychological testing with technician, Joanna Harris, BS, under the supervision of Dr. Macarthur Critchley. The patient did not appear overtly distressed by the testing session, per behavioral observation or via self-report to the technician. Rest breaks were offered. Joanna Harris will return within 2 weeks for a feedback session with Dr. Si Raider at which time her test performances, clinical impressions and treatment recommendations will be reviewed in detail. The patient understands she can contact our office should she require our assistance before this time.  Full report to follow.
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
## 2016-02-27 ENCOUNTER — Encounter: Payer: Self-pay | Admitting: Psychology

## 2016-02-27 ENCOUNTER — Ambulatory Visit (INDEPENDENT_AMBULATORY_CARE_PROVIDER_SITE_OTHER): Payer: BLUE CROSS/BLUE SHIELD | Admitting: Psychology

## 2016-02-27 DIAGNOSIS — F3130 Bipolar disorder, current episode depressed, mild or moderate severity, unspecified: Secondary | ICD-10-CM

## 2016-02-27 DIAGNOSIS — F0781 Postconcussional syndrome: Secondary | ICD-10-CM

## 2016-02-27 DIAGNOSIS — S060X0S Concussion without loss of consciousness, sequela: Secondary | ICD-10-CM | POA: Diagnosis not present

## 2016-03-13 ENCOUNTER — Other Ambulatory Visit: Payer: Self-pay | Admitting: Obstetrics & Gynecology

## 2016-03-15 ENCOUNTER — Telehealth (INDEPENDENT_AMBULATORY_CARE_PROVIDER_SITE_OTHER): Payer: Self-pay | Admitting: *Deleted

## 2016-03-15 DIAGNOSIS — G8929 Other chronic pain: Secondary | ICD-10-CM

## 2016-03-15 DIAGNOSIS — M545 Low back pain: Principal | ICD-10-CM

## 2016-03-15 NOTE — Telephone Encounter (Signed)
Patient had lumbar ESI on 06/14/2015 and would like to schedule another. Would you like me to put in order or do you need to see patient back in the office?  Please advise.

## 2016-03-15 NOTE — Telephone Encounter (Signed)
Ok to order [facility] thanks

## 2016-03-15 NOTE — Telephone Encounter (Signed)
Pt. Called to schedule epidural, she had one looks like back in march 2017. I tried to schedule pt appt, but she wanted to make sure it was going to be for the epidural. Pt requesting call back

## 2016-03-15 NOTE — Telephone Encounter (Signed)
Order entered for [facility]. I attempted to call patient and reached a phone mail system to log in to. Unable to leave message.

## 2016-03-27 ENCOUNTER — Ambulatory Visit (INDEPENDENT_AMBULATORY_CARE_PROVIDER_SITE_OTHER): Payer: BLUE CROSS/BLUE SHIELD | Admitting: Physical Medicine and Rehabilitation

## 2016-03-27 ENCOUNTER — Encounter (INDEPENDENT_AMBULATORY_CARE_PROVIDER_SITE_OTHER): Payer: Self-pay | Admitting: Physical Medicine and Rehabilitation

## 2016-03-27 VITALS — BP 147/67 | HR 53 | Temp 98.1°F

## 2016-03-27 DIAGNOSIS — M5116 Intervertebral disc disorders with radiculopathy, lumbar region: Secondary | ICD-10-CM | POA: Insufficient documentation

## 2016-03-27 MED ORDER — METHYLPREDNISOLONE ACETATE 80 MG/ML IJ SUSP
80.0000 mg | Freq: Once | INTRAMUSCULAR | Status: AC
  Administered 2016-03-27: 80 mg

## 2016-03-27 MED ORDER — LIDOCAINE HCL (PF) 1 % IJ SOLN: 0.3300 mL | Freq: Once | INTRAMUSCULAR | Status: AC

## 2016-03-27 NOTE — Patient Instructions (Signed)
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
## 2016-03-27 NOTE — Procedures (Signed)
Lumbar Epidural Steroid Injection - Interlaminar Approach with Fluoroscopic Guidance  Patient: Joanna Harris      Date of Birth: 1986/02/25 MRN: [id number] PCP: Milford Cage, PA      Visit Date: 03/27/2016   Universal Protocol:    Date/Time: 12/13/172:38 PM  Consent Given By: the patient  Position: PRONE  Additional Comments: Vital signs were monitored before and after the procedure. Patient was prepped and draped in the usual sterile fashion. The correct patient, procedure, and site was verified.   Injection Procedure Details:  Procedure Site One Meds Administered:  Meds ordered this encounter  Medications  . lidocaine (PF) (XYLOCAINE) 1 % injection 0.3 mL  . methylPREDNISolone acetate (DEPO-MEDROL) injection 80 mg     Laterality: Right  Location/Site:  L4-L5  Needle size: 20 G  Needle type: Tuohy  Needle Placement: Paramedian epidural  Findings:  -Contrast Used: 1 mL iohexol 180 mg iodine/mL   -Comments: Excellent flow of contrast into the epidural space.  Procedure Details: Using a paramedian approach from the side mentioned above, the region overlying the inferior lamina was localized under fluoroscopic visualization and the soft tissues overlying this structure were infiltrated with 4 ml. of 1% Lidocaine without Epinephrine. The Tuohy needle was inserted into the epidural space using a paramedian approach.   The epidural space was localized using loss of resistance along with lateral and bi-planar fluoroscopic views.  After negative aspirate for air, blood, and CSF, a 2 ml. volume of Isovue-250 was injected into the epidural space and the flow of contrast was observed. Radiographs were obtained for documentation purposes.    The injectate was administered into the level noted above.   Additional Comments:  The patient tolerated the procedure well Dressing: Band-Aid    Post-procedure details: Patient was observed during the procedure. Post-procedure  instructions were reviewed.  Patient left the clinic in stable condition.

## 2016-04-02 NOTE — Patient Instructions (Addendum)
Your procedure is scheduled on:  Thursday, Dec, 21, 2017  Enter through the [employer] of [facility] at:  11:30 AM  Pick up the phone at the desk and dial [phone].  Call this number if you have problems the morning of surgery: [phone].  Remember: Do NOT eat food:  After Midnight Wednesday  Do NOT drink clear liquids after:  9:00 AM day of surgery  Take these medicines the morning of surgery with a SIP OF WATER:  Zyrtec,  Xanax if needed  Bring Asthma Inhaler day of surgery  Stop ALL herbal medications and fish oil at this time   Do NOT wear jewelry (body piercing), metal hair clips/bobby pins, make-up, or nail polish. Do NOT wear lotions, powders, or perfumes.  You may wear deodorant. Do NOT shave for 48 hours prior to surgery. Do NOT bring valuables to the hospital. Contacts, dentures, or bridgework may not be worn into surgery.  Have a responsible adult drive you home and stay with you for 24 hours after your procedure

## 2016-04-03 ENCOUNTER — Encounter (HOSPITAL_COMMUNITY): Payer: Self-pay

## 2016-04-03 ENCOUNTER — Encounter (HOSPITAL_COMMUNITY)
Admission: RE | Admit: 2016-04-03 | Discharge: 2016-04-03 | Disposition: A | Discharge status: Home / Self Care | Payer: BLUE CROSS/BLUE SHIELD | Source: Ambulatory Visit | Attending: Obstetrics & Gynecology | Admitting: Obstetrics & Gynecology

## 2016-04-03 DIAGNOSIS — N736 Female pelvic peritoneal adhesions (postinfective): Secondary | ICD-10-CM | POA: Diagnosis not present

## 2016-04-03 DIAGNOSIS — Z886 Allergy status to analgesic agent status: Secondary | ICD-10-CM | POA: Diagnosis not present

## 2016-04-03 DIAGNOSIS — N7011 Chronic salpingitis: Secondary | ICD-10-CM | POA: Diagnosis not present

## 2016-04-03 DIAGNOSIS — Z880 Allergy status to penicillin: Secondary | ICD-10-CM | POA: Diagnosis not present

## 2016-04-03 DIAGNOSIS — Z6841 Body Mass Index (BMI) 40.0 and over, adult: Secondary | ICD-10-CM | POA: Diagnosis not present

## 2016-04-03 DIAGNOSIS — N946 Dysmenorrhea, unspecified: Secondary | ICD-10-CM | POA: Diagnosis present

## 2016-04-03 DIAGNOSIS — Z888 Allergy status to other drugs, medicaments and biological substances status: Secondary | ICD-10-CM | POA: Diagnosis not present

## 2016-04-03 DIAGNOSIS — R102 Pelvic and perineal pain: Secondary | ICD-10-CM | POA: Diagnosis not present

## 2016-04-03 DIAGNOSIS — G8929 Other chronic pain: Secondary | ICD-10-CM | POA: Diagnosis not present

## 2016-04-03 HISTORY — DX: Tachycardia, unspecified: R00.0

## 2016-04-03 HISTORY — DX: Unspecified intracranial injury with loss of consciousness of unspecified duration, initial encounter: S06.9X9A

## 2016-04-03 HISTORY — DX: Headache, unspecified: R51.9

## 2016-04-03 HISTORY — DX: Fibromyalgia: M79.7

## 2016-04-03 HISTORY — DX: Headache: R51

## 2016-04-03 HISTORY — DX: Tuberculosis of spine: A18.01

## 2016-04-03 LAB — CBC
HCT: 37.8 % (ref 36.0–46.0)
Hemoglobin: 12.9 g/dL (ref 12.0–15.0)
MCH: 30.6 pg (ref 26.0–34.0)
MCHC: 34.1 g/dL (ref 30.0–36.0)
MCV: 89.8 fL (ref 78.0–100.0)
PLATELETS: 278 10*3/uL (ref 150–400)
RBC: 4.21 MIL/uL (ref 3.87–5.11)
RDW: 12.5 % (ref 11.5–15.5)
WBC: 9.7 10*3/uL (ref 4.0–10.5)

## 2016-04-04 ENCOUNTER — Encounter (HOSPITAL_COMMUNITY): Payer: Self-pay

## 2016-04-04 ENCOUNTER — Encounter (HOSPITAL_COMMUNITY)
Admission: RE | Disposition: A | Discharge status: Home / Self Care | Payer: Self-pay | Source: Ambulatory Visit | Attending: Obstetrics & Gynecology

## 2016-04-04 ENCOUNTER — Ambulatory Visit (HOSPITAL_COMMUNITY): Payer: BLUE CROSS/BLUE SHIELD | Admitting: Anesthesiology

## 2016-04-04 ENCOUNTER — Ambulatory Visit (HOSPITAL_COMMUNITY)
Admission: RE | Admit: 2016-04-04 | Discharge: 2016-04-04 | Disposition: A | Discharge status: Home / Self Care | Payer: BLUE CROSS/BLUE SHIELD | Source: Ambulatory Visit | Attending: Obstetrics & Gynecology | Admitting: Obstetrics & Gynecology

## 2016-04-04 DIAGNOSIS — G8929 Other chronic pain: Secondary | ICD-10-CM | POA: Insufficient documentation

## 2016-04-04 DIAGNOSIS — N7011 Chronic salpingitis: Secondary | ICD-10-CM | POA: Insufficient documentation

## 2016-04-04 DIAGNOSIS — N736 Female pelvic peritoneal adhesions (postinfective): Secondary | ICD-10-CM | POA: Diagnosis not present

## 2016-04-04 DIAGNOSIS — Z888 Allergy status to other drugs, medicaments and biological substances status: Secondary | ICD-10-CM | POA: Insufficient documentation

## 2016-04-04 DIAGNOSIS — N946 Dysmenorrhea, unspecified: Secondary | ICD-10-CM | POA: Insufficient documentation

## 2016-04-04 DIAGNOSIS — R102 Pelvic and perineal pain: Secondary | ICD-10-CM | POA: Insufficient documentation

## 2016-04-04 DIAGNOSIS — Z6841 Body Mass Index (BMI) 40.0 and over, adult: Secondary | ICD-10-CM | POA: Insufficient documentation

## 2016-04-04 DIAGNOSIS — Z886 Allergy status to analgesic agent status: Secondary | ICD-10-CM | POA: Insufficient documentation

## 2016-04-04 DIAGNOSIS — Z880 Allergy status to penicillin: Secondary | ICD-10-CM | POA: Insufficient documentation

## 2016-04-04 HISTORY — PX: CHROMOPERTUBATION: SHX6288

## 2016-04-04 HISTORY — PX: ROBOTIC ASSISTED LAPAROSCOPIC LYSIS OF ADHESION: SHX6080

## 2016-04-04 LAB — PREGNANCY, URINE: Preg Test, Ur: NEGATIVE

## 2016-04-04 SURGERY — CHROMOPERTUBATION, FALLOPIAN TUBE
Anesthesia: General | Site: Vagina

## 2016-04-04 MED ORDER — SODIUM CHLORIDE 0.9 % IJ SOLN
INTRAMUSCULAR | Status: DC | PRN
Start: 2016-04-04 — End: 2016-04-04
  Administered 2016-04-04: 30 mL via INTRAVENOUS

## 2016-04-04 MED ORDER — HYDROMORPHONE HCL 1 MG/ML IJ SOLN
INTRAMUSCULAR | Status: AC
  Filled 2016-04-04: qty 1

## 2016-04-04 MED ORDER — SCOPOLAMINE 1 MG/3DAYS TD PT72: 1.0000 | MEDICATED_PATCH | Freq: Once | TRANSDERMAL | 12 refills | Status: AC

## 2016-04-04 MED ORDER — LACTATED RINGERS IV SOLN
INTRAVENOUS | Status: DC
  Administered 2016-04-04: 15:00:00 via INTRAVENOUS
  Administered 2016-04-04: 125 mL/h via INTRAVENOUS

## 2016-04-04 MED ORDER — LACTATED RINGERS IR SOLN
Status: DC | PRN
  Administered 2016-04-04: 3000 mL

## 2016-04-04 MED ORDER — SCOPOLAMINE 1 MG/3DAYS TD PT72
MEDICATED_PATCH | TRANSDERMAL | Status: AC
  Administered 2016-04-04: 1.5 mg via TRANSDERMAL
  Filled 2016-04-04: qty 1

## 2016-04-04 MED ORDER — FENTANYL CITRATE (PF) 100 MCG/2ML IJ SOLN
INTRAMUSCULAR | Status: DC | PRN
  Administered 2016-04-04: 25 ug via INTRAVENOUS
  Administered 2016-04-04: 100 ug via INTRAVENOUS
  Administered 2016-04-04: 25 ug via INTRAVENOUS
  Administered 2016-04-04: 100 ug via INTRAVENOUS

## 2016-04-04 MED ORDER — MEPERIDINE HCL 25 MG/ML IJ SOLN: 6.2500 mg | INTRAMUSCULAR | Status: DC | PRN

## 2016-04-04 MED ORDER — ROPIVACAINE HCL 5 MG/ML IJ SOLN
INTRAMUSCULAR | Status: AC
  Filled 2016-04-04: qty 30

## 2016-04-04 MED ORDER — METHYLENE BLUE 0.5 % INJ SOLN
INTRAVENOUS | Status: AC
  Filled 2016-04-04: qty 10

## 2016-04-04 MED ORDER — ACETAMINOPHEN 325 MG PO TABS: 325.0000 mg | ORAL_TABLET | ORAL | Status: DC | PRN

## 2016-04-04 MED ORDER — HYDROCODONE-ACETAMINOPHEN 7.5-325 MG PO TABS: 1.0000 | ORAL_TABLET | Freq: Once | ORAL | Status: DC | PRN

## 2016-04-04 MED ORDER — ACETAMINOPHEN 160 MG/5ML PO SOLN: 325.0000 mg | ORAL | Status: DC | PRN

## 2016-04-04 MED ORDER — MIDAZOLAM HCL 2 MG/2ML IJ SOLN
INTRAMUSCULAR | Status: AC
  Filled 2016-04-04: qty 2

## 2016-04-04 MED ORDER — SODIUM CHLORIDE 0.9 % IJ SOLN
INTRAMUSCULAR | Status: AC
Start: 2016-04-04 — End: 2016-04-04
  Filled 2016-04-04: qty 30

## 2016-04-04 MED ORDER — MIDAZOLAM HCL 2 MG/2ML IJ SOLN
INTRAMUSCULAR | Status: DC | PRN
  Administered 2016-04-04: 2 mg via INTRAVENOUS

## 2016-04-04 MED ORDER — FENTANYL CITRATE (PF) 100 MCG/2ML IJ SOLN: 25.0000 ug | INTRAMUSCULAR | Status: DC | PRN

## 2016-04-04 MED ORDER — FENTANYL CITRATE (PF) 100 MCG/2ML IJ SOLN
INTRAMUSCULAR | Status: AC
  Filled 2016-04-04: qty 2

## 2016-04-04 MED ORDER — ROCURONIUM BROMIDE 100 MG/10ML IV SOLN
INTRAVENOUS | Status: AC
  Filled 2016-04-04: qty 1

## 2016-04-04 MED ORDER — PROPOFOL 10 MG/ML IV BOLUS
INTRAVENOUS | Status: AC
  Filled 2016-04-04: qty 20

## 2016-04-04 MED ORDER — SCOPOLAMINE 1 MG/3DAYS TD PT72
1.0000 | MEDICATED_PATCH | Freq: Once | TRANSDERMAL | Status: DC
  Administered 2016-04-04: 1.5 mg via TRANSDERMAL

## 2016-04-04 MED ORDER — DEXAMETHASONE SODIUM PHOSPHATE 10 MG/ML IJ SOLN
INTRAMUSCULAR | Status: DC | PRN
  Administered 2016-04-04: 10 mg via INTRAVENOUS

## 2016-04-04 MED ORDER — BUPIVACAINE HCL (PF) 0.25 % IJ SOLN
INTRAMUSCULAR | Status: AC
  Filled 2016-04-04: qty 30

## 2016-04-04 MED ORDER — PROPOFOL 10 MG/ML IV BOLUS
INTRAVENOUS | Status: DC | PRN
  Administered 2016-04-04: 180 mg via INTRAVENOUS

## 2016-04-04 MED ORDER — FENTANYL CITRATE (PF) 250 MCG/5ML IJ SOLN
INTRAMUSCULAR | Status: AC
  Filled 2016-04-04: qty 5

## 2016-04-04 MED ORDER — HYDROMORPHONE HCL 1 MG/ML IJ SOLN
INTRAMUSCULAR | Status: DC | PRN
  Administered 2016-04-04: 1 mg via INTRAVENOUS

## 2016-04-04 MED ORDER — ACETAMINOPHEN 10 MG/ML IV SOLN
1000.0000 mg | Freq: Once | INTRAVENOUS | Status: AC
  Administered 2016-04-04: 1000 mg via INTRAVENOUS
  Filled 2016-04-04: qty 100

## 2016-04-04 MED ORDER — SUGAMMADEX SODIUM 200 MG/2ML IV SOLN
INTRAVENOUS | Status: DC | PRN
  Administered 2016-04-04: 200 mg via INTRAVENOUS

## 2016-04-04 MED ORDER — DEXAMETHASONE SODIUM PHOSPHATE 4 MG/ML IJ SOLN
INTRAMUSCULAR | Status: AC
  Filled 2016-04-04: qty 1

## 2016-04-04 MED ORDER — ROCURONIUM BROMIDE 100 MG/10ML IV SOLN
INTRAVENOUS | Status: DC | PRN
  Administered 2016-04-04: 40 mg via INTRAVENOUS
  Administered 2016-04-04: 10 mg via INTRAVENOUS

## 2016-04-04 MED ORDER — ONDANSETRON HCL 4 MG/2ML IJ SOLN
INTRAMUSCULAR | Status: DC | PRN
  Administered 2016-04-04: 4 mg via INTRAVENOUS

## 2016-04-04 MED ORDER — HYDROMORPHONE HCL 1 MG/ML IJ SOLN
0.2500 mg | INTRAMUSCULAR | Status: DC | PRN
  Administered 2016-04-04 (×4): 0.5 mg via INTRAVENOUS

## 2016-04-04 MED ORDER — ONDANSETRON HCL 4 MG/2ML IJ SOLN: 4.0000 mg | Freq: Once | INTRAMUSCULAR | Status: DC | PRN

## 2016-04-04 MED ORDER — LIDOCAINE HCL (CARDIAC) 20 MG/ML IV SOLN
INTRAVENOUS | Status: DC | PRN
Start: 2016-04-04 — End: 2016-04-04
  Administered 2016-04-04: 50 mg via INTRAVENOUS

## 2016-04-04 MED ORDER — OXYCODONE-ACETAMINOPHEN 5-325 MG PO TABS: 1.0000 | ORAL_TABLET | Freq: Four times a day (QID) | ORAL | 0 refills | Status: DC | PRN

## 2016-04-04 MED ORDER — LIDOCAINE HCL (CARDIAC) 20 MG/ML IV SOLN
INTRAVENOUS | Status: AC
  Filled 2016-04-04: qty 5

## 2016-04-04 MED ORDER — SODIUM CHLORIDE 0.9 % IV SOLN
INTRAVENOUS | Status: DC | PRN
  Administered 2016-04-04: 15:00:00

## 2016-04-04 MED ORDER — FENTANYL CITRATE (PF) 100 MCG/2ML IJ SOLN
25.0000 ug | INTRAMUSCULAR | Status: DC | PRN
  Administered 2016-04-04 (×3): 50 ug via INTRAVENOUS

## 2016-04-04 MED ORDER — ONDANSETRON HCL 4 MG/2ML IJ SOLN
INTRAMUSCULAR | Status: AC
  Filled 2016-04-04: qty 2

## 2016-04-04 MED ORDER — BUPIVACAINE HCL (PF) 0.25 % IJ SOLN
INTRAMUSCULAR | Status: DC | PRN
  Administered 2016-04-04: 9 mL

## 2016-04-04 SURGICAL SUPPLY — 68 items
BARRIER ADHS 3X4 INTERCEED (GAUZE/BANDAGES/DRESSINGS) IMPLANT
CABLE HIGH FREQUENCY MONO STRZ (ELECTRODE) IMPLANT
CATH FOLEY 3WAY  5CC 16FR (CATHETERS)
CATH FOLEY 3WAY 5CC 16FR (CATHETERS) IMPLANT
CATH ROBINSON RED A/P 16FR (CATHETERS) IMPLANT
CLOTH BEACON ORANGE TIMEOUT ST (SAFETY) ×5 IMPLANT
CONT PATH 16OZ SNAP LID 3702 (MISCELLANEOUS) IMPLANT
COVER BACK TABLE 60X90IN (DRAPES) ×10 IMPLANT
COVER TIP SHEARS 8 DVNC (MISCELLANEOUS) ×3 IMPLANT
COVER TIP SHEARS 8MM DA VINCI (MISCELLANEOUS) ×2
DECANTER SPIKE VIAL GLASS SM (MISCELLANEOUS) ×10 IMPLANT
DEFOGGER SCOPE WARMER CLEARIFY (MISCELLANEOUS) ×5 IMPLANT
DERMABOND ADVANCED (GAUZE/BANDAGES/DRESSINGS) ×2
DERMABOND ADVANCED .7 DNX12 (GAUZE/BANDAGES/DRESSINGS) ×3 IMPLANT
DRAPE ROBOTICS STRL (DRAPES) ×5 IMPLANT
DRSG OPSITE POSTOP 3X4 (GAUZE/BANDAGES/DRESSINGS) ×5 IMPLANT
DURAPREP 26ML APPLICATOR (WOUND CARE) ×5 IMPLANT
ELECT REM PT RETURN 9FT ADLT (ELECTROSURGICAL) ×5
ELECTRODE REM PT RTRN 9FT ADLT (ELECTROSURGICAL) ×3 IMPLANT
FILTER SMOKE EVAC LAPAROSHD (FILTER) IMPLANT
FORCEPS CUTTING 33CM 5MM (CUTTING FORCEPS) IMPLANT
GAUZE VASELINE 3X9 (GAUZE/BANDAGES/DRESSINGS) IMPLANT
GLOVE BIO SURGEON STRL SZ7 (GLOVE) ×15 IMPLANT
GLOVE BIOGEL PI IND STRL 7.0 (GLOVE) ×15 IMPLANT
GLOVE BIOGEL PI INDICATOR 7.0 (GLOVE) ×10
GOWN STRL REUS W/TWL LRG LVL3 (GOWN DISPOSABLE) ×15 IMPLANT
KIT ACCESSORY DA VINCI DISP (KITS) ×2
KIT ACCESSORY DVNC DISP (KITS) ×3 IMPLANT
LEGGING LITHOTOMY PAIR STRL (DRAPES) ×5 IMPLANT
MANIPULATOR UTERINE 4.5 ZUMI (MISCELLANEOUS) ×5 IMPLANT
NS IRRIG 1000ML POUR BTL (IV SOLUTION) IMPLANT
OCCLUDER COLPOPNEUMO (BALLOONS) IMPLANT
PACK LAPAROSCOPY BASIN (CUSTOM PROCEDURE TRAY) IMPLANT
PACK ROBOT WH (CUSTOM PROCEDURE TRAY) ×5 IMPLANT
PACK ROBOTIC GOWN (GOWN DISPOSABLE) ×5 IMPLANT
PACK TRENDGUARD 450 HYBRID PRO (MISCELLANEOUS) IMPLANT
PACK TRENDGUARD 600 HYBRD PROC (MISCELLANEOUS) ×3 IMPLANT
PAD PREP 24X48 CUFFED NSTRL (MISCELLANEOUS) ×5 IMPLANT
POUCH SPECIMEN RETRIEVAL 10MM (ENDOMECHANICALS) IMPLANT
PROTECTOR NERVE ULNAR (MISCELLANEOUS) ×10 IMPLANT
SCISSORS LAP 5X35 DISP (ENDOMECHANICALS) IMPLANT
SET CYSTO W/LG BORE CLAMP LF (SET/KITS/TRAYS/PACK) IMPLANT
SET EXT STPCK 38  W/4W STPCK (IV SOLUTION) ×5 IMPLANT
SET IRRIG TUBING LAPAROSCOPIC (IRRIGATION / IRRIGATOR) ×5 IMPLANT
SET TRI-LUMEN FLTR TB AIRSEAL (TUBING) ×5 IMPLANT
SLEEVE XCEL OPT CAN 5 100 (ENDOMECHANICALS) IMPLANT
SOLUTION ELECTROLUBE (MISCELLANEOUS) IMPLANT
SUT VICRYL 0 UR6 27IN ABS (SUTURE) ×5 IMPLANT
SUT VICRYL 4-0 PS2 18IN ABS (SUTURE) ×10 IMPLANT
SYR 30ML LL (SYRINGE) ×5 IMPLANT
SYR 50ML LL SCALE MARK (SYRINGE) ×5 IMPLANT
SYR TB 1ML 25GX5/8 (SYRINGE) IMPLANT
SYSTEM CONVERTIBLE TROCAR (TROCAR) ×5 IMPLANT
TIP UTERINE 5.1X6CM LAV DISP (MISCELLANEOUS) IMPLANT
TIP UTERINE 6.7X10CM GRN DISP (MISCELLANEOUS) IMPLANT
TIP UTERINE 6.7X6CM WHT DISP (MISCELLANEOUS) IMPLANT
TIP UTERINE 6.7X8CM BLUE DISP (MISCELLANEOUS) IMPLANT
TOWEL OR 17X24 6PK STRL BLUE (TOWEL DISPOSABLE) ×15 IMPLANT
TRENDGUARD 450 HYBRID PRO PACK (MISCELLANEOUS)
TRENDGUARD 600 HYBRID PROC PK (MISCELLANEOUS) ×5
TROCAR 12M 150ML BLUNT (TROCAR) ×5 IMPLANT
TROCAR BALLN 12MMX100 BLUNT (TROCAR) ×5 IMPLANT
TROCAR DISP BLADELESS 8 DVNC (TROCAR) IMPLANT
TROCAR DISP BLADELESS 8MM (TROCAR)
TROCAR PORT AIRSEAL 5X120 (TROCAR) IMPLANT
TROCAR XCEL NON-BLD 5MMX100MML (ENDOMECHANICALS) IMPLANT
WARMER LAPAROSCOPE (MISCELLANEOUS) ×5 IMPLANT
WATER STERILE IRR 1000ML POUR (IV SOLUTION) ×5 IMPLANT

## 2016-04-04 NOTE — Transfer of Care (Signed)
Immediate Anesthesia Transfer of Care Note  Patient: Joanna Harris  Procedure(s) Performed: Procedure(s): CHROMOPERTUBATION (Bilateral) ROBOTIC ASSISTED LYSIS OF ADHESIONS (N/A)  Patient Location: PACU  Anesthesia Type:General  Level of Consciousness: awake, alert , oriented and patient cooperative  Airway & Oxygen Therapy: Patient Spontanous Breathing and Patient connected to nasal cannula oxygen  Post-op Assessment: Report given to RN and Post -op Vital signs reviewed and stable  Post vital signs: Reviewed and stable  Last Vitals:  Vitals:   04/04/16 1144  BP: 118/75  Pulse: 62  Resp: 20  Temp: 36.8 C    Last Pain:  Vitals:   04/04/16 1144  TempSrc: Oral  PainSc: 5       Patients Stated Pain Goal: 4 ([id number])  Complications: No apparent anesthesia complications

## 2016-04-04 NOTE — Discharge Instructions (Signed)
Laparoscopy, Care After. Removal of ovarian adhesions/ scar tissue Introduction Refer to this sheet in the next few weeks. These instructions provide you with information about caring for yourself after your procedure. Your health care provider may also give you more specific instructions. Your treatment has been planned according to current medical practices, but problems sometimes occur. Call your health care provider if you have any problems or questions after your procedure. What can I expect after the procedure? After your procedure, it is common to have mild discomfort in the throat and abdomen. Follow these instructions at home:  Take over-the-counter and prescription medicines only as told by your health care provider.  Do not drive for 24 hours if you received a sedative.  Return to your normal activities as told by your health care provider.  Do not take baths, swim, or use a hot tub until your health care provider approves. You may shower.  Follow instructions from your health care provider about how to take care of your incision. Make sure you:  Wash your hands with soap and water before you change your bandage (dressing). If soap and water are not available, use hand sanitizer.  Change your dressing as told by your health care provider.  Leave stitches (sutures), skin glue, or adhesive strips in place. These skin closures may need to stay in place for 2 weeks or longer. If adhesive strip edges start to loosen and curl up, you may trim the loose edges. Do not remove adhesive strips completely unless your health care provider tells you to do that.  Check your incision area every day for signs of infection. Check for:  More redness, swelling, or pain.  More fluid or blood.  Warmth.  Pus or a bad smell.  It is your responsibility to get the results of your procedure. Ask your health care provider or the department performing the procedure when your results will be  ready. Contact a health care provider if:  There is new pain in your shoulders.  You feel light-headed or faint.  You are unable to pass gas or unable to have a bowel movement.  You feel nauseous or you vomit.  You develop a rash.  You have more redness, swelling, or pain around your incision.  You have more fluid or blood coming from your incision.  Your incision feels warm to the touch.  You have pus or a bad smell coming from your incision.  You have a fever or chills. Get help right away if:  Your pain is getting worse.  You have ongoing vomiting.  The edges of your incision open up.  You have trouble breathing.  You have chest pain. This information is not intended to replace advice given to you by your health care provider. Make sure you discuss any questions you have with your health care provider. Document Released: 03/13/2015 Document Revised: 09/07/2015 Document Reviewed: 12/13/2014  2017 Elsevier   You  May remove the patch behind your ear on or before Sunday 04/07/16. Wash your hands with soap and water after contact with the patch.
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
## 2016-04-04 NOTE — Op Note (Signed)
Preoperative diagnosis: Pelvic pain, Left pelvic pain, dysmenorrhea. Suspected endometriosis Postoperative diagnosis: Left ovarian adhesions. Left hydrosalpinx. No pelvic endometriosis Procedure: daVinci robot assisted laparoscopic lysis of pelvic adhesions, left ovariolysis and chromopertubation Surgeon: Dr Azucena Fallen, MD Assistants: Derrell Lolling, CNM Anesthesia General Endotracheal IV fluids: 1000 cc LR EBL: minimal 25 cc  Urine: clear in foley 0000000 cc Complications none Disposition PACU and home Specimens Pelvic adhesions Findings: Left ovary and tube adhesions, left ovary adhesions with ovarian fossa, left tubal mild hydrosalpinx. Normal spill of dye from right fallopian tube and delayed spill from left tube was noted after adhesiolysis.   Procedure 30 yo female with chronic pelvic pain, especially on the left, history of endometriosis. She is here for operative laparoscopy with da Vinci robot assistance.    Risk and complications of surgery including infection, bleeding, damage to internal organs, other complications including pneumonia, VTE were reviewed. Also reviewed recurrent pelvic pain, adhesions, endometriosis.   Patient voiced understanding. Informed written consent was obtained and patient was brought to the operating room with IV running. Antibiotic was not indicated. Timeout was carried out and confirmed. She underwent general anesthesia without difficulty and was given dorsal lithotomy position. Pelvic examination under anesthesia was normal. She was prepped and draped in standard fashion. Foley catheter inserted. Speculum was placed anterior lip of cervix was grasped with tenaculum, uterus was sounded to 7 cm, cervical os dilated and Zumi manipulator was inserted and balloon secured, retractors and tenaculum removed.  Gloves and gown changed attention was focused on the abdomen.  A  10 mm vertical incision was made in the umbilicus and carried down to the fascia which was  deep, and was incised. Posterior rectus sheath identified and grasped and cut and peritoneal entry made under vision. Hassan canula inseted after taking purse-string stitch on the fascia and secured to [place]. Insufflation was begun with CO2 and a 0 daVinci laparoscope was introduced. Entry was uneventful. 3 other incisions made after injecting skin with Marcaine, two on the left and one on the right. 2 Robot ports and 1 assistance Air-seal port were inserted under vision. Robot was docked from the right after giving trendelenburg position that was tolerated by patient. PK and scissors placed in robotic arm on left and right each. Left upper port was assistance port. Air-flow switched.  Pelvic was evaluated. Uterus and right tube and ovary were normal, right ureter was seen well and not adherent. Left tube was thicker and appeared a bit swollen. Left ovary was slightly enlarged with 1 cm cyst, likely physiologic. Left ovary was not free, it was noted to be adherent to ovarian fossa. Chromopertubation done with Methylene blue dye. Excellent spill noted from right tube, no spill from the left tube. Left Tube and ovary also noted to have adhesions. Carefully dissection done to release tubo-ovarian adhesions and then ovarian adhesions to ovarian fossa and lateral pelvic wall were incised while watching left ureter and bowel. Hemostasis obtained with cautery.  Chromopertubation was repeated after lysis of adhesions of left tube and ovary and now slow release of methylene blue dye noted from left tube as well.  There was some peritoneal reaction and reactive fimbrial and peritoneal blebby cysts that were excised. Anterior and posterior cul de sac were clear of adhesions and endometriosis.  Liver and bowel appeared grossly normal.  All instruments were removed. Robot was de-docked. Interceed was wrapped around the left ovary, especially in the area of adhesiolysis. 30 cc dilute Ropivacaine instilled in the peritoneal  cavity. Fascial incision closed together with purse string of 0-Vicryl. The skin was approximated using 4-0 Vicryl in subcuticular fashion. Dermabond was applied at the incision. The uterine manipulator and foley were removed. Hemostasis was excellent.  All counts were correct x 2.  Patient brought out to the recovery room after extubation in stable condition. Plan is to discharge home from recovery room. Surgical findings were discussed with patient's family.  Followup with Dr. Benjie Karvonen in office in 3-4  weeks.
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
## 2016-04-04 NOTE — Anesthesia Procedure Notes (Signed)
Procedure Name: Intubation Date/Time: 04/04/2016 12:57 PM Performed by: Casimer Lanius A Pre-anesthesia Checklist: Patient identified, Emergency Drugs available, Suction available and Patient being monitored Patient Re-evaluated:Patient Re-evaluated prior to inductionOxygen Delivery Method: Circle system utilized and Simple face mask Preoxygenation: Pre-oxygenation with 100% oxygen Intubation Type: IV induction Ventilation: Mask ventilation without difficulty Laryngoscope Size: Mac and 3 Grade View: Grade II Tube type: Oral Tube size: 7.0 mm Number of attempts: 1 Airway Equipment and Method: Stylet Placement Confirmation: ETT inserted through vocal cords under direct vision,  positive ETCO2 and breath sounds checked- equal and bilateral Secured at: 20 (right lip) cm Tube secured with: Tape Dental Injury: Teeth and Oropharynx as per pre-operative assessment

## 2016-04-04 NOTE — Progress Notes (Signed)
Pt states medication prescribed for pain at home is ineffective for her pain.  Requesting Dr. Benjie Karvonen prescribe Roxicodone which works better. Dr. Benjie Karvonen notified and new prescription brought over for pt.  Pt talkative and up to bathroom, discharge instructions reviewed with pt and friend.  Family in to see patient prior to discharge. Patient states her pain is back up to a 7, encouraged pt to go directly to pharmacy get pain medication and take at home.

## 2016-04-05 NOTE — Anesthesia Postprocedure Evaluation (Signed)
Anesthesia Post Note  Patient: Geologist, engineering  Procedure(s) Performed: Procedure(s) (LRB): CHROMOPERTUBATION (Bilateral) ROBOTIC ASSISTED LYSIS OF ADHESIONS (N/A)  Patient location during evaluation: PACU Anesthesia Type: General Level of consciousness: awake Pain management: pain level controlled Vital Signs Assessment: post-procedure vital signs reviewed and stable Respiratory status: spontaneous breathing Cardiovascular status: stable Postop Assessment: no signs of nausea or vomiting Anesthetic complications: no        Last Vitals:  Vitals:   04/04/16 1730 04/04/16 1812  BP: 113/68 (!) 114/56  Pulse: 62 (!) 56  Resp: 14 18  Temp: 36.8 C 36.6 C    Last Pain:  Vitals:   04/04/16 1812  TempSrc:   PainSc: 7    Pain Goal: Patients Stated Pain Goal: 4 (04/04/16 1730)               Antwine Agosto JR,JOHN Mateo Flow

## 2016-04-09 ENCOUNTER — Encounter (HOSPITAL_COMMUNITY): Payer: Self-pay | Admitting: Obstetrics & Gynecology

## 2016-04-16 ENCOUNTER — Ambulatory Visit (INDEPENDENT_AMBULATORY_CARE_PROVIDER_SITE_OTHER): Payer: BLUE CROSS/BLUE SHIELD | Admitting: Neurology

## 2016-04-16 ENCOUNTER — Encounter: Payer: Self-pay | Admitting: Neurology

## 2016-04-16 VITALS — BP 122/74 | HR 111 | Temp 97.9°F | Ht 62.0 in | Wt 220.1 lb

## 2016-04-16 DIAGNOSIS — G43709 Chronic migraine without aura, not intractable, without status migrainosus: Secondary | ICD-10-CM | POA: Diagnosis not present

## 2016-04-16 DIAGNOSIS — F0781 Postconcussional syndrome: Secondary | ICD-10-CM

## 2016-04-16 NOTE — Patient Instructions (Signed)
Consider supplements:  Magnesium citrate 400mg  to 600mg  daily, riboflavin 400mg , Coenzyme Q 10 100mg  three times daily, turmeric 500mg  twice daily  For trouble sleeping, consider melatonin 3 to 5mg  at bedtime  Follow up with me if you need to readdress headaches
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
## 2016-05-02 ENCOUNTER — Emergency Department (HOSPITAL_COMMUNITY)
Admission: EM | Admit: 2016-05-02 | Discharge: 2016-05-03 | Disposition: A | Discharge status: Home / Self Care | Payer: BLUE CROSS/BLUE SHIELD | Attending: Emergency Medicine | Admitting: Emergency Medicine

## 2016-05-02 ENCOUNTER — Emergency Department (HOSPITAL_COMMUNITY): Payer: BLUE CROSS/BLUE SHIELD

## 2016-05-02 ENCOUNTER — Encounter (HOSPITAL_COMMUNITY): Payer: Self-pay

## 2016-05-02 DIAGNOSIS — R0789 Other chest pain: Secondary | ICD-10-CM | POA: Insufficient documentation

## 2016-05-02 DIAGNOSIS — J069 Acute upper respiratory infection, unspecified: Secondary | ICD-10-CM | POA: Insufficient documentation

## 2016-05-02 DIAGNOSIS — R0602 Shortness of breath: Secondary | ICD-10-CM | POA: Insufficient documentation

## 2016-05-02 DIAGNOSIS — J029 Acute pharyngitis, unspecified: Secondary | ICD-10-CM | POA: Diagnosis present

## 2016-05-02 DIAGNOSIS — Z79899 Other long term (current) drug therapy: Secondary | ICD-10-CM | POA: Diagnosis not present

## 2016-05-02 LAB — CBC WITH DIFFERENTIAL/PLATELET
Basophils Absolute: 0 10*3/uL (ref 0.0–0.1)
Basophils Relative: 0 %
Eosinophils Absolute: 0 10*3/uL (ref 0.0–0.7)
Eosinophils Relative: 1 %
HEMATOCRIT: 37.8 % (ref 36.0–46.0)
Hemoglobin: 12.7 g/dL (ref 12.0–15.0)
LYMPHS ABS: 0.6 10*3/uL — AB (ref 0.7–4.0)
Lymphocytes Relative: 10 %
MCH: 29.6 pg (ref 26.0–34.0)
MCHC: 33.6 g/dL (ref 30.0–36.0)
MCV: 88.1 fL (ref 78.0–100.0)
MONO ABS: 0.3 10*3/uL (ref 0.1–1.0)
MONOS PCT: 5 %
NEUTROS ABS: 5.3 10*3/uL (ref 1.7–7.7)
NEUTROS PCT: 84 %
Platelets: 201 10*3/uL (ref 150–400)
RBC: 4.29 MIL/uL (ref 3.87–5.11)
RDW: 12.9 % (ref 11.5–15.5)
WBC: 6.2 10*3/uL (ref 4.0–10.5)

## 2016-05-02 LAB — COMPREHENSIVE METABOLIC PANEL
ALBUMIN: 3.8 g/dL (ref 3.5–5.0)
ALT: 17 U/L (ref 14–54)
AST: 21 U/L (ref 15–41)
Alkaline Phosphatase: 62 U/L (ref 38–126)
Anion gap: 15 (ref 5–15)
BILIRUBIN TOTAL: 0.9 mg/dL (ref 0.3–1.2)
BUN: 6 mg/dL (ref 6–20)
CHLORIDE: 102 mmol/L (ref 101–111)
CO2: 18 mmol/L — AB (ref 22–32)
Calcium: 9.1 mg/dL (ref 8.9–10.3)
Creatinine, Ser: 0.75 mg/dL (ref 0.44–1.00)
GFR calc Af Amer: >60 mL/min (ref >60)
GFR calc non Af Amer: >60 mL/min (ref >60)
GLUCOSE: 83 mg/dL (ref 65–99)
POTASSIUM: 3.7 mmol/L (ref 3.5–5.1)
SODIUM: 135 mmol/L (ref 135–145)
Total Protein: 6.8 g/dL (ref 6.5–8.1)

## 2016-05-02 LAB — TROPONIN I: Troponin I: <0.03 ng/mL (ref <0.03)

## 2016-05-02 LAB — CK: Total CK: 82 U/L (ref 38–234)

## 2016-05-02 LAB — BRAIN NATRIURETIC PEPTIDE: B Natriuretic Peptide: 16.9 pg/mL (ref 0.0–100.0)

## 2016-05-02 MED ORDER — SODIUM CHLORIDE 0.9 % IV BOLUS (SEPSIS)
1000.0000 mL | Freq: Once | INTRAVENOUS | Status: AC
  Administered 2016-05-02: 1000 mL via INTRAVENOUS

## 2016-05-02 MED ORDER — HYDROCODONE-ACETAMINOPHEN 5-325 MG PO TABS: 1.0000 | ORAL_TABLET | ORAL | 0 refills | Status: AC | PRN

## 2016-05-02 NOTE — ED Provider Notes (Signed)
[facility] DEPT Provider Note   CSN: [id number] Arrival date & time: 05/02/16  2135     History   Chief Complaint Chief Complaint  Patient presents with  . Chest Pain  . Nasal Congestion  . Sore Throat    HPI Joanna Harris is a 31 y.o. female.  Pt presents to the ED today with chest pain, nasal congestion, and sore throat.  The pt said that she had a fever at home for which she took 1000 mg of tylenol.  The pt had PNA on 12/26.  She took all of her medication.  The pt said she was outside in the snow all day yesterday and felt fine until that evening.  The pt said that her legs feel heavy.  Pt said that she exercises frequently and the soreness to her legs feels different than when she exercises.        Past Medical History:  Diagnosis Date  . Adenomyosis of uterus determined by biopsy   . Endometriosis   . Fibromyalgia    questionable  . Headache    Migraines  . Pott's disease   . Potts disease   . S/P endometrial ablation   . 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 Radiculopathy due to lumbar intervertebral disc disorder 03/27/2016    Past Surgical History:  Procedure Laterality Date  . APPENDECTOMY    . BREAST SURGERY     bilateral lumpectomy  . CHROMOPERTUBATION Bilateral 04/04/2016   Procedure: CHROMOPERTUBATION;  Surgeon: Azucena Fallen, MD;  Location: [facility];  Service: Gynecology;  Laterality: Bilateral;  . ENDOMETRIAL ABLATION    . KNEE ARTHROSCOPY     times 2  . ROBOTIC ASSISTED LAPAROSCOPIC LYSIS OF ADHESION N/A 04/04/2016   Procedure: ROBOTIC ASSISTED LYSIS OF ADHESIONS;  Surgeon: Azucena Fallen, MD;  Location: [facility];  Service: Gynecology;  Laterality: N/A;    OB History    No data available       Home Medications    Prior to Admission medications   Medication Sig Start Date End Date Taking? Authorizing Provider    albuterol (PROVENTIL HFA;VENTOLIN HFA) 108 (90 Base) MCG/ACT inhaler Inhale 2 puffs into the lungs every 4 (four) hours as needed for wheezing or shortness of breath (and/or cough).    Historical Provider, MD  ALPRAZolam Duanne Moron) 0.5 MG tablet Take 0.5 mg by mouth 3 (three) times daily as needed for anxiety.     Historical Provider, MD  amphetamine-dextroamphetamine (ADDERALL) 20 MG tablet Take 20 mg by mouth 3 (three) times daily.    Historical Provider, MD  B Complex Vitamins (B COMPLEX PO) Take 15 mLs by mouth daily.    Historical Provider, MD  Biotin 1 MG CAPS Take 1 g by mouth daily.    Historical Provider, MD  cetirizine (ZYRTEC) 10 MG tablet Take 10 mg by mouth 2 (two) times daily.     Historical Provider, MD  cholecalciferol (VITAMIN D) 1000 units tablet Take 2,000 Units by mouth at bedtime.    Historical Provider, MD  doxycycline (MONODOX) 100 MG capsule Take 100 mg by mouth 2 (two) times daily.    Historical Provider, MD  HYDROcodone-acetaminophen (NORCO/VICODIN) 5-325 MG tablet Take 1 tablet by mouth every 4 (four) hours as needed. 05/02/16   Isla Pence, MD  magnesium gluconate (MAGONATE) 500 MG tablet Take 500 mg by  mouth every other day.    Historical Provider, MD  Multiple Vitamin (MULTIVITAMIN WITH MINERALS) TABS tablet Take 1 tablet by mouth 2 (two) times daily.     Historical Provider, MD  niacin (NIASPAN) 1000 MG CR tablet Take 1,000 mg by mouth daily.    Historical Provider, MD  Omega-3 Fatty Acids (FISH OIL) 1200 MG CAPS Take 1,200 mg by mouth at bedtime.     Historical Provider, MD  topiramate (TOPAMAX) 100 MG tablet Take 100-200 mg by mouth 2 (two) times daily. Pt takes one tablet in the morning and two at bedtime.    Historical Provider, MD    Family History No family history on file.  Social History Social History  Substance Use Topics  . Smoking status: Never Smoker  . Smokeless tobacco: Never Used  . Alcohol use Yes     Comment: occasional     Allergies    Amoxicillin; Ketorolac; and Ibuprofen   Review of Systems Review of Systems  Constitutional: Positive for fever.  Respiratory: Positive for cough, chest tightness and shortness of breath.   All other systems reviewed and are negative.    Physical Exam Updated Vital Signs BP 112/70   Pulse 92   Temp 99.1 F (37.3 C) (Oral)   Resp 14   Ht 5\' 2"  (1.575 m)   Wt 220 lb (99.8 kg)   LMP 03/07/2016 (Approximate)   SpO2 100%   BMI 40.24 kg/m   Physical Exam  Constitutional: She is oriented to person, place, and time. She appears well-developed and well-nourished.  HENT:  Head: Normocephalic and atraumatic.  Right Ear: External ear normal.  Left Ear: External ear normal.  Nose: Nose normal.  Mouth/Throat: Oropharynx is clear and moist.  Eyes: Conjunctivae and EOM are normal. Pupils are equal, round, and reactive to light.  Neck: Normal range of motion. Neck supple.  Cardiovascular: Regular rhythm, normal heart sounds and intact distal pulses.  Tachycardia present.   Pulmonary/Chest: Effort normal and breath sounds normal.  Abdominal: Soft. Bowel sounds are normal.  Musculoskeletal: Normal range of motion.  Neurological: She is alert and oriented to person, place, and time.  Skin: Skin is warm.  Psychiatric: She has a normal mood and affect. Her behavior is normal. Judgment and thought content normal.  Nursing note and vitals reviewed.    ED Treatments / Results  Labs (all labs ordered are listed, but only abnormal results are displayed) Labs Reviewed  COMPREHENSIVE METABOLIC PANEL - Abnormal; Notable for the following:       Result Value   CO2 18 (*)    All other components within normal limits  CBC WITH DIFFERENTIAL/PLATELET - Abnormal; Notable for the following:    Lymphs Abs 0.6 (*)    All other components within normal limits  BRAIN NATRIURETIC PEPTIDE  TROPONIN I  CK    EKG  EKG Interpretation  Date/Time:  Thursday May 02 2016 21:42:30  EST Ventricular Rate:  93 PR Interval:    QRS Duration: 89 QT Interval:  345 QTC Calculation: 430 R Axis:   77 Text Interpretation:  Sinus rhythm Borderline short PR interval Confirmed by Gilford Raid MD, Quindarius Cabello ([id number]) on 05/02/2016 11:08:45 PM       Radiology Dg Chest 2 View  Result Date: 05/02/2016 CLINICAL DATA:  Chest pain and cold symptoms EXAM: CHEST  2 VIEW COMPARISON:  08/15/2015 FINDINGS: The heart size and mediastinal contours are within normal limits. Both lungs are clear. The visualized skeletal structures are unremarkable. IMPRESSION:  No active cardiopulmonary disease. Electronically Signed   By: Donavan Foil M.D.   On: 05/02/2016 23:28    Procedures Procedures (including critical care time)  Medications Ordered in ED Medications  sodium chloride 0.9 % bolus 1,000 mL (1,000 mLs Intravenous New Bag/Given 05/02/16 2306)     Initial Impression / Assessment and Plan / ED Course  I have reviewed the triage vital signs and the nursing notes.  Pertinent labs & imaging results that were available during my care of the patient were reviewed by me and considered in my medical decision making (see chart for details).    Pt is feeling better after IVFs.  She will be d/c home with instructions to f/u with pcp.  Final Clinical Impressions(s) / ED Diagnoses   Final diagnoses:  Viral upper respiratory tract infection    New Prescriptions New Prescriptions   HYDROCODONE-ACETAMINOPHEN (NORCO/VICODIN) 5-325 MG TABLET    Take 1 tablet by mouth every 4 (four) hours as needed.     Isla Pence, MD 05/02/16 [phone]

## 2016-05-02 NOTE — ED Triage Notes (Signed)
Pt via EMS with CP and cold sx that began at 0400 this morning. Pt reports L sided "heavy" CP in addition to nasal congestion, fever, dizziness, SOB, and HA. Pt reports she had a temp of 101.7 for which she took 1,000 mg of tylenol at around 9 PM tonight. Pt also reports one episode of vomiting this AM and frequent loose stools. Per pt, she was diagnosed with pneumonia about 2 weeks ago and finished her course of abx. 131/83, HR 96, 20 Rr, 100% RA.

## 2016-05-02 NOTE — ED Notes (Signed)
Unsuccessful attempt to start IV x 2. Callie, RN at bedside to try and start line.

## 2016-05-02 NOTE — ED Notes (Signed)
Patient transported to X-ray 

## 2016-09-26 ENCOUNTER — Other Ambulatory Visit: Payer: Self-pay | Admitting: Advanced Practice Midwife

## 2016-09-26 DIAGNOSIS — N6310 Unspecified lump in the right breast, unspecified quadrant: Secondary | ICD-10-CM

## 2016-10-02 ENCOUNTER — Ambulatory Visit
Admission: RE | Admit: 2016-10-02 | Discharge: 2016-10-02 | Disposition: A | Discharge status: Home / Self Care | Payer: 59 | Source: Ambulatory Visit | Attending: Advanced Practice Midwife | Admitting: Advanced Practice Midwife

## 2016-10-02 DIAGNOSIS — N6310 Unspecified lump in the right breast, unspecified quadrant: Secondary | ICD-10-CM

## 2016-10-08 ENCOUNTER — Telehealth: Payer: Self-pay | Admitting: Genetics

## 2016-10-08 NOTE — Telephone Encounter (Signed)
Received a call from the pt to schedule a genetic counseling appt. Appt scheduled for the pt to see Ria Comment on 6/28 at 1pm. Pt aware to arrive 30 minutes early. Pt voiced understanding.

## 2016-10-09 ENCOUNTER — Telehealth: Payer: Self-pay | Admitting: Genetics

## 2016-10-09 NOTE — Telephone Encounter (Signed)
  Talked to Joanna Harris about her cousin and aunts genetic testing reports.  I asked if she might be able to bring a copy of a family member's genetic test report so that we can order the appropriate genetic testing.  She said she would try to bring it to her appointment tomorrow.

## 2016-10-10 ENCOUNTER — Ambulatory Visit (HOSPITAL_BASED_OUTPATIENT_CLINIC_OR_DEPARTMENT_OTHER): Payer: 59 | Admitting: Genetics

## 2016-10-10 ENCOUNTER — Other Ambulatory Visit: Payer: 59

## 2016-10-10 DIAGNOSIS — D0590 Unspecified type of carcinoma in situ of unspecified breast: Secondary | ICD-10-CM

## 2016-10-10 DIAGNOSIS — Z8481 Family history of carrier of genetic disease: Secondary | ICD-10-CM

## 2016-10-10 DIAGNOSIS — Z806 Family history of leukemia: Secondary | ICD-10-CM

## 2016-10-10 DIAGNOSIS — Z803 Family history of malignant neoplasm of breast: Secondary | ICD-10-CM | POA: Diagnosis not present

## 2016-10-11 ENCOUNTER — Telehealth: Payer: Self-pay | Admitting: Genetics

## 2016-10-11 ENCOUNTER — Encounter: Payer: Self-pay | Admitting: Genetics

## 2016-10-11 DIAGNOSIS — D0590 Unspecified type of carcinoma in situ of unspecified breast: Secondary | ICD-10-CM | POA: Insufficient documentation

## 2016-10-11 DIAGNOSIS — Z8481 Family history of carrier of genetic disease: Secondary | ICD-10-CM | POA: Insufficient documentation

## 2016-10-11 DIAGNOSIS — Z806 Family history of leukemia: Secondary | ICD-10-CM | POA: Insufficient documentation

## 2016-10-11 DIAGNOSIS — Z803 Family history of malignant neoplasm of breast: Secondary | ICD-10-CM | POA: Insufficient documentation

## 2016-10-11 NOTE — Telephone Encounter (Signed)
I left a message for Joanna Harris telling her that after reading through the scientific article she gave me, there are are no other genes I would add to her test order at this time.  If she gets her cousin's genetic test report, I can clarify further what the 2nd mutation was.  However, with the information we have now there is no extra testing that is indicated. I told her to call me back if she has any questions and that I will proceed as discussed/planned to order the 46 gene panel.

## 2016-10-11 NOTE — Progress Notes (Signed)
REFERRING PROVIDER: Evangeline Dakin, [address], [place] [address]  PRIMARY PROVIDER:  Milford Cage, PA  PRIMARY REASON FOR VISIT:  1. Family history of breast cancer   2. Family history of leukemia   3. Family history of BRCA gene positive   4. Malignant neoplasm of breast, stage 0, unspecified laterality     HISTORY OF PRESENT ILLNESS:   Joanna Harris, a 31 y.o. female, was seen for a [facility] cancer genetics consultation at the request of Dr. Purcell Nails due to a personal and family history of cancer.  Ms. Siverling presents to clinic today to discuss the possibility of a hereditary predisposition to cancer, genetic testing, and to further clarify her future cancer risks, as well as potential cancer risks for family members.   In 2010, at the age of 59, Ms. Yasuda was reportedly diagnosed with stage 0 nodules/ atypia of the right and left breast. She reports that she was taking a medication after this treatment, but is unsure what it was called. This was treated with a bilateral lumpectomy. Records from this diagnosis are not yet available  (she was treated in [place], [place]) She is now being worked up for the presence of a palpable mass in her left breast.    HORMONAL RISK FACTORS:  Menarche was at age 50.  First live birth at age: no births.  OCP use for approximately 4 years. (nuva ring) Ovaries intact: yes.  Hysterectomy: yes.  Menopausal status: premenopausal.   Past Medical History:  Diagnosis Date  . Adenomyosis of uterus determined by biopsy   . Breast cancer ([facility])    "stage 0 nodules" records unavailable  . Endometriosis   . Family history of BRCA gene positive   . Family history of breast cancer   . Family history of leukemia   . Fibromyalgia    questionable  . Headache    Migraines  . Pott's disease   . Potts disease   . S/P endometrial ablation   . 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 APPENDECTOMY    . BREAST SURGERY     bilateral lumpectomy  . CHROMOPERTUBATION Bilateral 04/04/2016   Procedure: CHROMOPERTUBATION;  Surgeon: Azucena Fallen, MD;  Location: [facility];  Service: Gynecology;  Laterality: Bilateral;  . ENDOMETRIAL ABLATION    . KNEE ARTHROSCOPY     times 2  . ROBOTIC ASSISTED LAPAROSCOPIC LYSIS OF ADHESION N/A 04/04/2016   Procedure: ROBOTIC ASSISTED LYSIS OF ADHESIONS;  Surgeon: Azucena Fallen, MD;  Location: [facility];  Service: Gynecology;  Laterality: N/A;    Social History   Social History  . Marital status: Divorced    Spouse name: N/A  . Number of children: N/A  . Years of education: N/A   Social History Main Topics  . Smoking status: Never Smoker  . Smokeless tobacco: Never Used  . Alcohol use Yes     Comment: occasional  . Drug use: No  . Sexual activity: Not Asked   Other Topics Concern  . None   Social History Narrative  . None     FAMILY HISTORY:  We obtained a detailed, 4-generation family history.  Significant diagnoses are listed below: Family History  Problem Relation Age of Onset  . Cancer Maternal Aunt 49       cancer type unk- specific age dx  unk-died at 62  . Leukemia Mother 54       current age:57  . Cancer Maternal Grandmother 62       cancer type unk- abdom. region. bleeding, had hysterectomy. died in 61's  . Cancer Maternal Grandfather 21       died in accident, was being worked up for possible cancer dx at time of death (type unk, maybe liver or pancreatic)  . Heart disease Paternal Grandmother 43       died in 51's  . BRCA 1/2 Other    Ms. Beedle has a 43 year-old sister who has no history of cancer or any children. Ms. Mey's father is in his late 9's with no history of cancer.  She has 4 paternal aunts and 1 paternal uncle (see below): -One paternal aunt is in her 6's and has no history of cancer.  She has one son in his late 35's  with no history of cancer.  -One paternal aunt is in her 39's and has 2 sons (ages 48 and lage 34's).  No history of cancer for these relatives. -One paternal aunt is in her 90's and has a son and daughter both in their 55's.  No history of cancer for these relatives.  -One paternal aunt is 78 and has 1 daughter in her 38's.  No history of cancer for these relatives.   -One paternal uncle is in his late 17's and has 5-6 children total.  The patient knows that one cousin is female and in his 47's, and another cousin is female and is 18.  No known history of cancer for these relatives.  Somewhat limited information about this part of the family.    Ms. Contino's paternal grandmother died in her 93's due to cardiac issues.  This paternal grandmother had some siblings who had liver cancer (history of alcohol use) Ms. Ferraiolo's paternal grandfather died in his 81's.  Cause is unknown and there is limited information about his side of the family.  Ms. Vanderkolk is aware of one paternal great aunt having a daughter with breast cancer.  The age of diagnosis is unknown.    Ms. Brumm's mother was diagnosed with leukemia at 56, and also had a hysterectomy/Oopherectomy done at 20.  She is now 38 and still being treated for her leukemia.  She had a lot of testing performed related to her cancer diagnosis and the patient reports it is possible she may have had some type of genetic testing.  However, she is not aware of any genetic testing that was previously done.   Ms. Rancourt has one maternal aunt who died from an unknown type of cancer at 47.  There is limited information about this relative's cancer diagnosis. This aunt has 3 sons in their 39's and 52's.  No history of cancer for those cousins.  Ms. Sawhney has a maternal uncle who is in his late 74's.  He has a son in his 26's and a son in his 26's.  No history of cancer for those relatives.   Ms. Loria has a number of 2nd cousins (her cousin's children) who have no known history of  cancer.  Ms. Baughman's maternal grand mother died in her 60's and shortly before death was diagnosed with cancer in her abdominal region.  The patient reports this grandmother was bleeding heavily and had a hysterectomy.    -this grandmother had a brother (patient's maternal great uncle) who was diagnosed with testicular cancer.  As far  as Ms. Heckel is aware her maternal great grandparents had no histoy of cancer.  Ms. Pelphrey's maternal grandfather died in his 24's due to an accident.  The patient reports that at the time of his accident he was being worked up for suspicion of cancer in the abdominal region (maybe liver or pancreatic).  This grandfather had 3 sisters (pateint's paternal great aunts) -One paternal great aunt is in her 69's with no history of cancer and no children. -One paternal great aunt is in her 25's and had a stage 0 breat cancer diagnosis in her 63's.  She has 2 sons.  One died at 11 due to an accident and the other died in his 94's due to medical complications related to developmental delay.   -One paternal great aunt was diagnosed with breast cancer at 19 and died at the age of 4. This great aunt had a daughter (patient's cousin once removed) who was diagnosed with stage 0 breast cancer and had genetic testing done in her 24's.  She was found to have a BRCA mutation.  The patient does not know the specific gene in which this mutation is in.  When she asked her cousin about the genetic testing she was unable to get a test report.  She did mention that in addition to a BRCA mutation that some other variant or mutation was identified- based on an article send by this cousin once removed, the patient believes the other finding was in the [id number] (CDKN2A) gene.  This cousin once removed has a daughter who is 78, a son who is 72, and a son who is 39.  No history of cancer.  Limited information is known about the patient's maternal great grand parents.    Ms. Romick is aware of previous family  history of genetic testing for hereditary cancer risks (details discussed above). Patient's maternal ancestors are of Polish/ Ashkenazi Jewish descent, and paternal ancestors are of African American/Black descent. There is Ashkenazi Jewish ancestry reported on the maternal grandmother's side of the family. There is No known consanguinity.  GENETIC COUNSELING ASSESSMENT: Kamilla Hands is a 31 y.o. female with who has family history of a BRCA mutation carrier, as well as a personal history which is somewhat suggestive of a Hereditary Cancer predisposition syndrome and predisposition to cancer. We, therefore, discussed and recommended the following at today's visit.   DISCUSSION: We reviewed the characteristics, features and inheritance patterns of hereditary cancer syndromes. We also discussed genetic testing, including the appropriate family members to test, the process of testing, insurance coverage and turn-around-time for results. We discussed the implications of a negative, positive and/or variant of uncertain significant result. We recommended Ms. Seely pursue genetic testing for the Common Hereditary Cancer gene panel. The Comprehensive Common Cancer Panel offered by [facility] includes sequencing and/or deletion duplication testing of the following 46 genes: APC, ATM, AXIN2, BAP1, BARD1, BMPR1A, BRCA1, BRCA2, BRIP1, CDH1, CDK4, CDKN2A, CHEK2, EPCAM, FANCC, FH, FLCN, HOXB13, MET, MITF,  MLH1, MSH2, MSH6, MUTYH, NBN, NF1, NTHL1,  PALB2, PMS2, POLD1, POLE, POT1, PTEN, RAD51C, RAD51D, RECQL, SCG5/GREM1, SDHB, SDHC, SDHD, SMAD4, STK11, TP53, TSC1, TSC2, and VHL.    We discussed that Hereditary Breast and Ovarian Cancer (HBOC) syndrome, caused by mutations in the BRCA1 and BRCA2 genes, is the most common cause of hereditary breast cancer. This syndrome increases an individual's lifetime risk to develop breast, ovarian, pancreatic, and other types of cancer.  Given that the patient's cousin once removed has a  mutation in  one of her BRCA1 or BRCA2 genes, it is possible that she may have this familial mutation.  Based on this genetic testing, and the early breast cancer diagnosis of her maternal great aunt, we predict there may be about a 12.5% chance she has this familial mutation based simply on Mendelian Inheritance.  However, her personal history of stage 0 nodules at the age of 67 does further increase suspicion for her to carry this mutation. There are also many other cancer predisposition syndromes caused by mutations in several other genes.   We discussed that if she is found to have a mutation in one of these genes, it may impact medical management recommendations such as increased cancer screenings and consideration of risk reducing surgeries.  A positive result could also have implications for the patient's family members.  A Negative result would be most meaningful if we were able to compare it to her cousin once removed's genetic test report.  However, if there is no BRCA mutation identified, it would be reasuring for Ms. Renken that she does not have the familial BRCA mutation on her maternal grandfather's side of the family.  A negative result for the other genes on the 46 gene panel would mean that we were unable to identify a hereditary component to her stage 0 cancer, but does not rule out the possibility of a hereditary basis for her cancer.  While reassuring, there could still be mutations that are undetectable by current technology, or in genes not yet tested or identified to increase cancer risk.  The patient also mentioned that her cousin once removed's genetic testing revealed an abnormal finding in another gene.  She showed me a scientific article that her cousin once removed sent her about the gene p16INK4a [facility]) and other markers.  I emphasized that if possible, having her cousin once removed's test report would be very helpful in making sure Ms. Allman's genetic testing includes all of the  findings seen in his cousin once removed. The CDKN2A gene is included on the Common Hereditary Cancer Panel that was ordered.  However, having that report would allow [place] to ensure all familial mutations are tested.      We discussed the potential to find a Variant of Uncertain Significance or VUS.  These are variants that have not yet been identified as pathogenic or benign, and it is unknown if this variant is associated with increased cancer risk or if this is a normal finding.  Most VUS's are reclassified to benign or likely benign.   It should not be used to make medical management decisions. With time, we suspect the lab will determine the significance of any VUS's identified if any.   Based on Ms. Keown's family history of cancer, she meets medical criteria for genetic testing. Despite that she meets criteria, she may still have an out of pocket cost. We discussed that if her out of pocket cost for testing is over $100, the laboratory will call and confirm whether she wants to proceed with testing.  If the out of pocket cost of testing is less than $100 she will be billed by the genetic testing laboratory.   We discussed that some people fear the potential for  genetic discrimination when undergoing genetic testing.  A federal law called the Genetic Information Non-Discrimination Act (GINA) of 2008 helps protect individuals against genetic discrimination based on their genetic test results.  It impacts both health insurance and employment.  With health insurance, it protects against increased  premiums, being kicked off insurance or being forced to take a test in order to be insured.  For employment it protects against hiring, firing and promoting decisions based on genetic test results.  Health status due to a cancer diagnosis is not protected under GINA.  PLAN: After considering the risks, benefits, and limitations, Ms. Gunnerson  provided informed consent to pursue genetic testing and the blood sample was  sent to [facility] for analysis of the Common Hereditary Cancer Panel (46 genes). Results should be available within approximately 2-3 weeks' time, at which point they will be disclosed by telephone to Ms. Kliethermes, as will any additional recommendations warranted by these results.  Ms. Sweigert will try to obtain a copy of her cousin's test results and ask more information about any testing that was done at the time of her mother's leukemia diagnosis to see if any genetic testing was done.   Ms. Senk will receive a summary of her genetic counseling visit and a copy of her results once available. This information will also be available in Epic. We encouraged Ms. Marsico to remain in contact with cancer genetics annually so that we can continuously update the family history and inform her of any changes in cancer genetics and testing that may be of benefit for her family. Ms. Musto's questions were answered to her satisfaction today. Our contact information was provided should additional questions or concerns arise.  Regardless of Ms. Traister's results, based on Ms. Faul's family history, we recommended her mother, who was diagnosed with leukemai at age 76, have genetic counseling and testing if this was not already done Ms. Conchas will let us know if we can be of any assistance in coordinating genetic counseling and/or testing for this family member.   Lastly, we encouraged Ms. Tomberlin to remain in contact with cancer genetics annually so that we can continuously update the family history and inform her of any changes in cancer genetics and testing that may be of benefit for this family.   Ms.  Beville's questions were answered to her satisfaction today. Our contact information was provided should additional questions or concerns arise. Thank you for the referral and allowing [place] to share in the care of your patient.   Tana Felts, MS Genetic Counselor [email or web address] phone: [phone]  The  patient was seen for a total of 55 minutes in face-to-face genetic counseling.

## 2016-10-15 ENCOUNTER — Telehealth: Payer: Self-pay | Admitting: Genetics

## 2016-10-15 NOTE — Telephone Encounter (Signed)
Joanna Harris was returning my call to verify that the testing was sent.  I informed her that her sample was sent for the 46 gene Common Hereditary Cancer Panel. I I reiterated that her testing could have implications for other family members, and that we will be able to best interpret her results if we are able to see her cousin's genetic testing report.  However we understand this may not be possible. I shared with Joanna Harris that her insurance prior-authorization was approved, and I told her I will call when her results are ready.

## 2016-10-23 ENCOUNTER — Telehealth: Payer: Self-pay | Admitting: Genetics

## 2016-10-23 NOTE — Telephone Encounter (Signed)
Revealed negative genetic testing.  Discussed that we do not know why she had breast cancer.   It could be due to a different gene that we are not testing, or maybe our current technology may not be able to pick something up.  However, there was no mutation identified in the BRCA1 or BRCA2 genes, so we presume that she did not inherited the familial mutation.  It would still be helpful to have her cousin once removed's genetic test report to ensure that we tested her for the specific mutations identified in this cousin.  She said she will still try to reach out and send [place] this report if she is able to get it.    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  We do not change medical management recommendations based on a VUS, as most often these are reclassified over time to benign.     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  At this time there is no additional increased cancer risks we are aware of for her.  Other family  Members could still be at an increased risk and we recommended that Joanna Harris's mother, maternal uncle, and her great aunts (on her grandfather's side) all pursue genetic counseling and genetic testing for the familial BRCA mutation.  Her sister may also consider genetic testing depending on her mother's genetic test results.    It will be important for her to keep in contact with genetics to keep up with whether additional testing may be needed.

## 2016-10-25 ENCOUNTER — Encounter: Payer: Self-pay | Admitting: Genetics

## 2016-10-25 ENCOUNTER — Ambulatory Visit: Payer: Self-pay | Admitting: Genetics

## 2016-10-25 DIAGNOSIS — Z1379 Encounter for other screening for genetic and chromosomal anomalies: Secondary | ICD-10-CM | POA: Insufficient documentation

## 2016-10-25 DIAGNOSIS — Z803 Family history of malignant neoplasm of breast: Secondary | ICD-10-CM

## 2016-10-25 DIAGNOSIS — Z806 Family history of leukemia: Secondary | ICD-10-CM

## 2016-10-25 DIAGNOSIS — D0590 Unspecified type of carcinoma in situ of unspecified breast: Secondary | ICD-10-CM

## 2016-10-25 DIAGNOSIS — Z8481 Family history of carrier of genetic disease: Secondary | ICD-10-CM

## 2016-10-25 NOTE — Progress Notes (Addendum)
HPI: Ms. Montalban was previously seen in the [facility] on 10/10/2016 due to a personal and family history of breast cancer as well as a known BRCA + genetic test result in her maternal cousin once removed.  We discussed concerns regarding a hereditary predisposition to cancer. Please refer to our prior cancer genetics clinic note for more information regarding Ms. Whiston's medical, social and family histories, and our assessment and recommendations, at the time. Ms. Chastain's recent genetic test results were disclosed to her, as well as recommendations warranted by these results. These results and recommendations are discussed in more detail below.  CANCER HISTORY:    Breast cancer, stage 0   10/11/2016 Initial Diagnosis    Breast cancer, stage 0     10/21/2016 Genetic Testing    Patient had genetic testing due to a personal and family history of breast cancer as well as a known familial BRCA mutation in a maternal cousin once removed.  She had genetic testing for the [facility] Common Hereditary Cancers Panel.  The Hereditary Gene Panel offered by [facility] includes sequencing and/or deletion duplication testing of the following 46 genes: APC, ATM, AXIN2, BARD1, BMPR1A, BRCA1, BRCA2, BRIP1, CDH1, CDKN2A (p14ARF), CDKN2A (p16INK4a), CHEK2, CTNNA1, DICER1, EPCAM (Deletion/duplication testing only), GREM1 (promoter region deletion/duplication testing only), KIT, MEN1, MLH1, MSH2, MSH3, MSH6, MUTYH, NBN, NF1, NHTL1, PALB2, PDGFRA, PMS2, POLD1, POLE, PTEN, RAD50, RAD51C, RAD51D, SDHB, SDHC, SDHD, SMAD4, SMARCA4. STK11, TP53, TSC1, TSC2, and VHL.  The following genes were evaluated for sequence changes only: SDHA and HOXB13 c.251G>A variant only.  Results: Negative for any pathogenic mutations in the 46 genes analyzed.  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 The date of this report is 10/21/2016.          FAMILY HISTORY:  We obtained a detailed,  4-generation family history.  Significant diagnoses are listed below: Family History  Problem Relation Age of Onset  . Cancer Maternal Aunt 55       cancer type unk- specific age dx unk-died at 88  . Leukemia Mother 82       current age:4  . Cancer Maternal Grandmother 42       cancer type unk- abdom. region. bleeding, had hysterectomy. died in 25's  . Cancer Maternal Grandfather 101       died in accident, was being worked up for possible cancer dx at time of death (type unk, maybe liver or pancreatic)  . Heart disease Paternal Grandmother 60       died in 15's  . BRCA 1/2 Other    Ms. Borbon has a 54 year-old sister who has no history of cancer or any children. Ms. Voisin's father is in his late 8's with no history of cancer.  She has 4 paternal aunts and 1 paternal uncle (see below): -One paternal aunt is in her 32's and has no history of cancer.  She has one son in his late 75's with no history of cancer.  -One paternal aunt is in her 36's and has 2 sons (ages 70 and lage 55's).  No history of cancer for these relatives. -One paternal aunt is in her 35's and has a son and daughter both in their 43's.  No history of cancer for these relatives.  -One paternal aunt is 45 and has 1 daughter in her 28's.  No history of cancer for these relatives.   -One paternal uncle is in his  late 60's and has 5-6 children total.  The patient knows that one cousin is female and in his 54's, and another cousin is female and is 83.  No known history of cancer for these relatives.  Somewhat limited information about this part of the family.    Ms. Netz's paternal grandmother died in her 62's due to cardiac issues.  This paternal grandmother had some siblings who had liver cancer (history of alcohol use) Ms. Zeien's paternal grandfather died in his 14's.  Cause is unknown and there is limited information about his side of the family.  Ms. Hoelzel is aware of one paternal great aunt having a daughter with breast cancer.   The age of diagnosis is unknown.    Ms. Saltzman's mother was diagnosed with leukemia at 58, and also had a hysterectomy/Oopherectomy done at 61.  She is now 5 and still being treated for her leukemia.  She had a lot of testing performed related to her cancer diagnosis and the patient reports it is possible she may have had some type of genetic testing.  However, she is not aware of any genetic testing that was previously done.   Ms. Rochelle has one maternal aunt who died from an unknown type of cancer at 53.  There is limited information about this relative's cancer diagnosis. This aunt has 3 sons in their 95's and 75's.  No history of cancer for those cousins.  Ms. Heffernan has a maternal uncle who is in his late 6's.  He has a son in his 27's and a son in his 47's.  No history of cancer for those relatives.   Ms. Watton has a number of 2nd cousins (her cousin's children) who have no known history of cancer.  Ms. Harrington's maternal grand mother died in her 64's and shortly before death was diagnosed with cancer in her abdominal region.  The patient reports this grandmother was bleeding heavily and had a hysterectomy.               -this grandmother had a brother (patient's maternal great uncle) who was diagnosed with testicular cancer.  As far as Ms. Blansett is aware her maternal great grandparents had no histoy of cancer.  Ms. Buday's maternal grandfather died in his 54's due to an accident.  The patient reports that at the time of his accident he was being worked up for suspicion of cancer in the abdominal region (maybe liver or pancreatic).  This grandfather had 3 sisters (pateint's paternal great aunts) -One paternal great aunt is in her 24's with no history of cancer and no children. -One paternal great aunt is in her 99's and had a stage 0 breat cancer diagnosis in her 46's.  She has 2 sons.  One died at 65 due to an accident and the other died in his 5's due to medical complications related to developmental  delay.   -One paternal great aunt was diagnosed with breast cancer at 21 and died at the age of 49. This great aunt had a daughter (patient's cousin once removed) who was diagnosed with stage 0 breast cancer and had genetic testing done in her 1's.  She was found to have a BRCA mutation.  The patient does not know the specific gene in which this mutation is in.  When she asked her cousin about the genetic testing she was unable to get a test report.  She did mention that in addition to a BRCA mutation that some other variant  or mutation was identified- based on an article send by this cousin once removed, the patient believes the other finding was in the p16INK4a (CDKN2A) gene.  This cousin once removed has a daughter who is 32, a son who is 38, and a son who is 54.  No history of cancer.  Limited information is known about the patient's maternal great grand parents.    Ms. Lightcap is aware of previous family history of genetic testing for hereditary cancer risks (details discussed above). Patient's maternal ancestors are of Polish/ Ashkenazi Jewish descent, and paternal ancestors are of African American/Black descent. There is Ashkenazi Jewish ancestry reported on the maternal grandmother's side of the family. There is No known consanguinity. GENETIC TEST RESULTS: Genetic testing performed through [facility] reported out on 10/21/2016 showed no pathogenic mutations. The [facility] Common Hereditary Cancers Panel was ordered. The Hereditary Gene Panel offered by [facility] includes sequencing and/or deletion duplication testing of the following 46 genes: APC, ATM, AXIN2, BARD1, BMPR1A, BRCA1, BRCA2, BRIP1, CDH1, CDKN2A (p14ARF), CDKN2A (p16INK4a), CHEK2, CTNNA1, DICER1, EPCAM (Deletion/duplication testing only), GREM1 (promoter region deletion/duplication testing only), KIT, MEN1, MLH1, MSH2, MSH3, MSH6, MUTYH, NBN, NF1, NHTL1, PALB2, PDGFRA, PMS2, POLD1, POLE, PTEN, RAD50, RAD51C, RAD51D, SDHB, SDHC, SDHD, SMAD4, SMARCA4.  STK11, TP53, TSC1, TSC2, and VHL.  The following genes were evaluated for sequence changes only: SDHA and HOXB13 c.251G>A variant only.The Hereditary Gene Panel offered by [facility] includes sequencing and/or deletion duplication testing of the following 46 genes: APC, ATM, AXIN2, BARD1, BMPR1A, BRCA1, BRCA2, BRIP1, CDH1, CDKN2A (p14ARF), CDKN2A (p16INK4a), CHEK2, CTNNA1, DICER1, EPCAM (Deletion/duplication testing only), GREM1 (promoter region deletion/duplication testing only), KIT, MEN1, MLH1, MSH2, MSH3, MSH6, MUTYH, NBN, NF1, NHTL1, PALB2, PDGFRA, PMS2, POLD1, POLE, PTEN, RAD50, RAD51C, RAD51D, SDHB, SDHC, SDHD, SMAD4, SMARCA4. STK11, TP53, TSC1, TSC2, and VHL.  The following genes were evaluated for sequence changes only: SDHA and HOXB13 c.251G>A variant only.  No pathogenic mutations were identified in the 46 genes tested. 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  The test report will be scanned into EPIC and will be located under the Molecular Pathology section of the Results Review tab.A portion of the result report is included below for reference.      Although we do not have Ms. Koike's maternal cousin once removed's BRCA genetic test report witth he specific mutation identified to verify, Ms. Jeppsen did not have any mutations in the BRCA1 or BRCA2 genes identified, so we believe she does not have the familial BRCA mutation.  It would still be beneficial to compare Ms. Alamo's test results with her maternal cousin once removed's result to ensure we tested her for specific mutations that were identified in this cousin once removed if possible.  Ms Tangeman will let us know if she is able to obtain this report.   Based on this negative result we cannot explain why Ms. 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  We discussed with Ms. [place] that because  current genetic testing is not perfect, it is possible there may be a gene mutation in one of these genes that current testing cannot detect, but that chance is small. We also discussed, that there could be another gene that has not yet been discovered, or that we have not yet tested, that is responsible for her cancer. Therefore, it is important to remain in touch with cancer genetics  in the future so that we can continue to offer Ms. Bernet the most up to date genetic testing.   Regarding the VUS in RAD51D: At this time, it is unknown if this variant is associated with increased cancer risk or if this is a normal finding, but most variants such as this get reclassified to being inconsequential. It should not be used to make medical management decisions. With time, we suspect the lab will determine the significance of this variant, if any. If we do learn more about it, we will try to contact Ms. Quimby to discuss it further. However, it is important to stay in touch with [place] periodically and keep the address and phone number up to date.    ADDITIONAL GENETIC TESTING: We discussed with Ms. Rude that there are other genes that are associated with increased cancer risk that can be analyzed. The laboratories that offer this testing look at these additional genes via a hereditary cancer gene panel. Should Ms. Baisley wish to pursue additional genetic testing, we are happy to discuss and coordinate this testing, at any time.    CANCER SCREENING RECOMMENDATIONS: Based on these negative genetic test results, there are no additional cancer risks we are aware of for Ms. Vise, and no additional screening or medical management that we would recommend for her at this time based on genetic testing.    This result is somewhat reassuring, and indicates that it is unlikely Ms. Asbill has an increased risk for a future cancer due to a mutation in one of these genes. This normal test also suggests that Ms. Hatchel's cancer was most  likely not due to an inherited predisposition associated with one of these genes.  Most cancers happen by chance and this negative test suggests that her cancer may fall into this category.  Therefore, it is recommended she continue to follow the cancer management and screening guidelines provided by her oncology and primary healthcare provider. Other factors such as her personal and family history may still affect her cancer risk.    RECOMMENDATIONS FOR FAMILY MEMBERS: Women in this family might be at some increased risk of developing cancer, over the general population risk, simply due to the family history of cancer. We recommended women in this family have a yearly mammogram beginning at age 23, or 27 years younger than the earliest onset of cancer, an annual clinical breast exam, and perform monthly breast self-exams. Women in this family should also have a gynecological exam as recommended by their primary provider. All family members should have a colonoscopy by age 102.  Based on Ms. Toulouse's family history, we recommended Ms. Tanori's mother and all of her maternal relatives of the cousin once removed (BRCA+) have genetic counseling and genetic testing for the familial BRCA mutation identified.  Ms. Freer will let us know if we can be of any assistance in coordinating genetic counseling and/or testing for this family member.   FOLLOW-UP: Lastly, we discussed with Ms. Christiano that cancer genetics is a rapidly advancing field and it is possible that new genetic tests will be appropriate for her and/or her family members in the future. We encouraged her to remain in contact with cancer genetics on an annual basis so we can update her personal and family histories and let her know of advances in cancer genetics that may benefit this family.   Our contact number was provided. Ms. Mende's questions were answered to her satisfaction, and she knows she is welcome to call us  at anytime with additional questions or  concerns.   Ferol Luz, MS Genetic Counselor [email or web address]

## 2016-12-03 ENCOUNTER — Ambulatory Visit (HOSPITAL_COMMUNITY)
Admission: EM | Admit: 2016-12-03 | Discharge: 2016-12-03 | Disposition: A | Discharge status: Home / Self Care | Payer: BLUE CROSS/BLUE SHIELD | Attending: Family Medicine | Admitting: Family Medicine

## 2016-12-03 ENCOUNTER — Ambulatory Visit (INDEPENDENT_AMBULATORY_CARE_PROVIDER_SITE_OTHER): Payer: BLUE CROSS/BLUE SHIELD

## 2016-12-03 ENCOUNTER — Encounter (HOSPITAL_COMMUNITY): Payer: Self-pay | Admitting: Emergency Medicine

## 2016-12-03 DIAGNOSIS — S93402A Sprain of unspecified ligament of left ankle, initial encounter: Secondary | ICD-10-CM

## 2016-12-03 MED ORDER — METHYLPREDNISOLONE 4 MG PO TBPK: ORAL_TABLET | ORAL | 0 refills | Status: AC

## 2016-12-03 NOTE — ED Provider Notes (Signed)
  [facility]   [id number] 12/03/16 Arrival Time: 1955  ASSESSMENT & PLAN:  1. Sprain of left ankle, unspecified ligament, initial encounter     Meds ordered this encounter  Medications  . methylPREDNISolone (MEDROL DOSEPAK) 4 MG TBPK tablet    Sig: Take 6-5-4-3-2-1 po qd    Dispense:  21 tablet    Refill:  0    Order Specific Question:   Supervising Provider    Answer:   Vanessa Kick [[id number]]    Reviewed expectations re: course of current medical issues. Questions answered. Outlined signs and symptoms indicating need for more acute intervention. Patient verbalized understanding. After Visit Summary given.   SUBJECTIVE:  Joanna Harris is a 31 y.o. female who presents with complaint of twisting left ankle and falling and having pain in left ankle.  ROS: As per HPI.   OBJECTIVE:  Vitals:   12/03/16 2010  BP: 96/66  Pulse: 74  Resp: 16  Temp: 98.2 F (36.8 C)  TempSrc: Oral  SpO2: 100%     General appearance: alert; no distress HEENT: normocephalic; atraumatic; conjunctivae normal; Lungs: clear to auscultation bilaterally Heart: regular rate and rhythm Abdomen: soft, non-tender; bowel sounds normal; no masses or organomegaly; no guarding or rebound tenderness Back: no CVA tenderness Extremities: TTP left lateral ankle w/o deformity or swelling. Neurologic: normal symmetric reflexes; normal gait Psychological:  alert and cooperative; normal mood and affect    Labs Reviewed - No data to display  Dg Ankle Complete Left  Result Date: 12/03/2016 CLINICAL DATA:  Left ankle pain and swelling after fall today. EXAM: LEFT ANKLE COMPLETE - 3+ VIEW COMPARISON:  None. FINDINGS: There is no evidence of fracture, dislocation, or joint effusion. There is no evidence of arthropathy or other focal bone abnormality. Soft tissues are unremarkable. IMPRESSION: Normal left ankle. Electronically Signed   By: Marijo Conception, M.D.   On: 12/03/2016 20:38    Allergies    Allergen Reactions  . Amoxicillin Anaphylaxis and Other (See Comments)    Has patient had a PCN reaction causing immediate rash, facial/tongue/throat swelling, SOB or lightheadedness with hypotension: Yes Has patient had a PCN reaction causing severe rash involving mucus membranes or skin necrosis: No Has patient had a PCN reaction that required hospitalization No Has patient had a PCN reaction occurring within the last 10 years: No If all of the above answers are "NO", then may proceed with Cephalosporin use.  Marland Kitchen Ketorolac Anaphylaxis  . Ibuprofen Hives    PMHx, SurgHx, SocialHx, Medications, and Allergies were reviewed in the Visit Navigator and updated as appropriate.      Lysbeth Penner, [place] 12/03/16 2044

## 2016-12-03 NOTE — ED Triage Notes (Signed)
Pt here for left ankle inj onset 1600... Sts she was hiking and twisted ankle  Sts she kept on walking 2-3 hours afterwards  Brought back on wheel chair... A&O x4... NAD

## 2016-12-29 ENCOUNTER — Emergency Department (HOSPITAL_COMMUNITY): Payer: Self-pay

## 2016-12-29 ENCOUNTER — Emergency Department (HOSPITAL_COMMUNITY)
Admission: EM | Admit: 2016-12-29 | Discharge: 2016-12-29 | Disposition: A | Discharge status: Home / Self Care | Payer: Self-pay | Attending: Emergency Medicine | Admitting: Emergency Medicine

## 2016-12-29 ENCOUNTER — Encounter (HOSPITAL_COMMUNITY): Payer: Self-pay | Admitting: Emergency Medicine

## 2016-12-29 DIAGNOSIS — R55 Syncope and collapse: Secondary | ICD-10-CM

## 2016-12-29 DIAGNOSIS — Z79899 Other long term (current) drug therapy: Secondary | ICD-10-CM | POA: Insufficient documentation

## 2016-12-29 HISTORY — DX: Tachycardia, unspecified: R00.0

## 2016-12-29 HISTORY — DX: Orthostatic hypotension: I95.1

## 2016-12-29 HISTORY — DX: Unspecified convulsions: R56.9

## 2016-12-29 LAB — CBC
HEMATOCRIT: 37.7 % (ref 36.0–46.0)
HEMOGLOBIN: 12.6 g/dL (ref 12.0–15.0)
MCH: 30.4 pg (ref 26.0–34.0)
MCHC: 33.4 g/dL (ref 30.0–36.0)
MCV: 91.1 fL (ref 78.0–100.0)
PLATELETS: 236 10*3/uL (ref 150–400)
RBC: 4.14 MIL/uL (ref 3.87–5.11)
RDW: 12.6 % (ref 11.5–15.5)
WBC: 8.2 10*3/uL (ref 4.0–10.5)

## 2016-12-29 LAB — BASIC METABOLIC PANEL
ANION GAP: 6 (ref 5–15)
BUN: 10 mg/dL (ref 6–20)
CHLORIDE: 107 mmol/L (ref 101–111)
CO2: 26 mmol/L (ref 22–32)
CREATININE: 0.87 mg/dL (ref 0.44–1.00)
Calcium: 9.4 mg/dL (ref 8.9–10.3)
GFR calc Af Amer: >60 mL/min (ref >60)
GFR calc non Af Amer: >60 mL/min (ref >60)
Glucose, Bld: 81 mg/dL (ref 65–99)
POTASSIUM: 4.2 mmol/L (ref 3.5–5.1)
SODIUM: 139 mmol/L (ref 135–145)

## 2016-12-29 LAB — I-STAT TROPONIN, ED: Troponin i, poc: 0 ng/mL (ref 0.00–0.08)

## 2016-12-29 LAB — I-STAT BETA HCG BLOOD, ED (MC, WL, AP ONLY)

## 2016-12-29 MED ORDER — PROCHLORPERAZINE EDISYLATE 5 MG/ML IJ SOLN
10.0000 mg | Freq: Once | INTRAMUSCULAR | Status: AC
  Administered 2016-12-29: 10 mg via INTRAVENOUS
  Filled 2016-12-29: qty 2

## 2016-12-29 MED ORDER — LACTATED RINGERS IV BOLUS (SEPSIS)
1000.0000 mL | Freq: Once | INTRAVENOUS | Status: AC
  Administered 2016-12-29: 1000 mL via INTRAVENOUS

## 2016-12-29 MED ORDER — DIPHENHYDRAMINE HCL 50 MG/ML IJ SOLN
12.5000 mg | Freq: Once | INTRAMUSCULAR | Status: AC
  Administered 2016-12-29: 12.5 mg via INTRAVENOUS
  Filled 2016-12-29: qty 1

## 2016-12-29 NOTE — ED Notes (Signed)
Pt verbalized understanding discharge instructions and denies any further needs or questions at this time. VS stable, ambulatory and steady gait.   

## 2016-12-29 NOTE — ED Triage Notes (Signed)
Per EMS: Pt has recently seen by a neurologist and was told to stay in a wheel chair and wear a helmet to get around.  Pt decided to walk to the bathroom with neither and had a syncopal event.  Pt has been fully imobilized by EMS.   Pt c/o of headache and L sided weakness.

## 2016-12-29 NOTE — ED Provider Notes (Signed)
[facility] DEPT Provider Note   CSN: [id number] Arrival date & time: 12/29/16  1804     History   Chief Complaint Chief Complaint  Patient presents with  . Loss of Consciousness    HPI Joanna Harris is a 31 y.o. female.  This is a 31 year old female with PMH of recently diagnosed nonepileptic seizures, migraines who presents from home after a fall at home.  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  The patient states since then she has been cared for by her friends who have instructed her to wear helmet to prevent any injury if she falls and has seizure activity.  The patient is not on any antiseizure medication.  She does not take any migraine prophylaxis.  Patient is on Klonopin, trazodone, Wellbutrin and Adderall.  Patient states she was at home alone sitting in a wheelchair and she states she is attempting to reach for something and fell out falling on the tile floor and endorses positive LOC.  She currently has a headache which she describes as consistent with her migraines, as well as midline neck pain. She denies any chest pain, dyspnea, visual disturbances.  She endorses left-sided weakness and sensory deficits.  She denies any changes in urination or bowel movements, abdominal pain, fevers.  She states she did not have these symptoms prior to the fall.   The history is provided by the patient.    Past Medical History:  Diagnosis Date  . POTS (postural orthostatic tachycardia syndrome) 2014  . Seizures ([place])     There are no active problems to display for this patient.   History reviewed. No pertinent surgical history.  OB History    No data available       Home Medications    Prior to Admission medications   Medication Sig Start Date End Date Taking? Authorizing Provider    amphetamine-dextroamphetamine (ADDERALL) 20 MG tablet Take 20 mg by mouth 3 (three) times daily.   Yes [provider]  buPROPion (WELLBUTRIN XL) 150 MG 24 hr tablet Take 150 mg by mouth 2 (two) times daily.   Yes [provider]  clonazePAM (KLONOPIN) 0.5 MG tablet Take 0.5 mg by mouth 2 (two) times daily as needed for anxiety.   Yes [provider]  traZODone (DESYREL) 50 MG tablet Take 50 mg by mouth at bedtime as needed for sleep.   Yes [provider]    Family History No family history on file.  Social History Social History  Substance Use Topics  . Smoking status: Never Smoker  . Smokeless tobacco: Never Used  . Alcohol use No     Allergies   Amoxicillin; Toradol [ketorolac tromethamine]; and Ibuprofen   Review of Systems Review of Systems  Constitutional: Positive for appetite change and fatigue. Negative for chills, diaphoresis and fever.  HENT: Negative for ear pain and sore throat.   Eyes: Negative for pain and visual disturbance.  Respiratory: Negative for cough and shortness of breath.   Cardiovascular: Negative for chest pain and palpitations.  Gastrointestinal: Negative for abdominal pain and vomiting.  Genitourinary: Negative for dysuria and hematuria.  Musculoskeletal: Negative for arthralgias and back pain.  Skin: Negative for color change and rash.  Neurological: Positive for tremors, weakness, light-headedness, numbness and headaches. Negative for dizziness, seizures, syncope, facial asymmetry and speech difficulty.  All other systems  reviewed and are negative.    Physical Exam Updated Vital Signs BP (!) 97/56   Pulse 68   Temp 97.6 F (36.4 C) (Oral)   Resp 20   Ht 5\' 7"  (1.702 m)   Wt 97.5 kg (215 lb)   LMP  (LMP Unknown)   SpO2 100%   BMI 33.67 kg/m   Physical Exam  Constitutional: She is oriented to person, place, and time. She appears well-developed and well-nourished. No distress.  HENT:  Head:  Normocephalic and atraumatic.  Eyes: Conjunctivae are normal.  Neck: Neck supple.  Cardiovascular: Normal rate and regular rhythm.   No murmur heard. Pulmonary/Chest: Effort normal and breath sounds normal. No respiratory distress.  Abdominal: Soft. There is no tenderness.  Musculoskeletal: She exhibits no edema.  Neurological: She is alert and oriented to person, place, and time. She displays no tremor. No cranial nerve deficit.  Patient has muscle strength 5/5 on right in upper and lower extremities. She has slightly less muscle strength in the left however she is easily distractable and redirected toward better strength output. Patient endorses sensory deficits on exam.  Skin: Skin is warm and dry.  Psychiatric: She has a normal mood and affect.  Nursing note and vitals reviewed.    ED Treatments / Results  Labs (all labs ordered are listed, but only abnormal results are displayed) Labs Reviewed  BASIC METABOLIC PANEL  CBC  I-STAT TROPONIN, ED  I-STAT BETA HCG BLOOD, ED ([facility], [facility], [facility] ONLY)    EKG  EKG Interpretation None       Radiology Dg Chest 2 View  Result Date: 12/29/2016 CLINICAL DATA:  Syncope EXAM: CHEST  2 VIEW COMPARISON:  None. FINDINGS: Normal heart size. Normal mediastinal contour. No pneumothorax. No pleural effusion. Lungs appear clear, with no acute consolidative airspace disease and no pulmonary edema. IMPRESSION: No active cardiopulmonary disease. Electronically Signed   By: Ilona Sorrel M.D.   On: 12/29/2016 19:52   Ct Head Wo Contrast  Result Date: 12/29/2016 CLINICAL DATA:  Syncope and fall EXAM: CT HEAD WITHOUT CONTRAST CT CERVICAL SPINE WITHOUT CONTRAST TECHNIQUE: Multidetector CT imaging of the head and cervical spine was performed following the standard protocol without intravenous contrast. Multiplanar CT image reconstructions of the cervical spine were also generated. COMPARISON:  None. FINDINGS: CT HEAD FINDINGS Brain: No mass lesion,  intraparenchymal hemorrhage or extra-axial collection. No evidence of acute cortical infarct. Brain parenchyma and CSF-containing spaces are normal for age. Vascular: No hyperdense vessel or unexpected calcification. Skull: Normal visualized skull base, calvarium and extracranial soft tissues. Sinuses/Orbits: No sinus fluid levels or advanced mucosal thickening. No mastoid effusion. Normal orbits. CT CERVICAL SPINE FINDINGS Alignment: No static subluxation. Facets are aligned. Occipital condyles are normally positioned. Skull base and vertebrae: No acute fracture. Soft tissues and spinal canal: No prevertebral fluid or swelling. No visible canal hematoma. Disc levels: No advanced spinal canal or neural foraminal stenosis. Upper chest: No pneumothorax, pulmonary nodule or pleural effusion. Other: Normal visualized paraspinal cervical soft tissues. IMPRESSION: Normal CT of the head and cervical spine. Electronically Signed   By: Ulyses Jarred M.D.   On: 12/29/2016 20:14   Ct Cervical Spine Wo Contrast  Result Date: 12/29/2016 CLINICAL DATA:  Syncope and fall EXAM: CT HEAD WITHOUT CONTRAST CT CERVICAL SPINE WITHOUT CONTRAST TECHNIQUE: Multidetector CT imaging of the head and cervical spine was performed following the standard protocol without intravenous contrast. Multiplanar CT image reconstructions of the cervical spine were also generated. COMPARISON:  None.  FINDINGS: CT HEAD FINDINGS Brain: No mass lesion, intraparenchymal hemorrhage or extra-axial collection. No evidence of acute cortical infarct. Brain parenchyma and CSF-containing spaces are normal for age. Vascular: No hyperdense vessel or unexpected calcification. Skull: Normal visualized skull base, calvarium and extracranial soft tissues. Sinuses/Orbits: No sinus fluid levels or advanced mucosal thickening. No mastoid effusion. Normal orbits. CT CERVICAL SPINE FINDINGS Alignment: No static subluxation. Facets are aligned. Occipital condyles are normally  positioned. Skull base and vertebrae: No acute fracture. Soft tissues and spinal canal: No prevertebral fluid or swelling. No visible canal hematoma. Disc levels: No advanced spinal canal or neural foraminal stenosis. Upper chest: No pneumothorax, pulmonary nodule or pleural effusion. Other: Normal visualized paraspinal cervical soft tissues. IMPRESSION: Normal CT of the head and cervical spine. Electronically Signed   By: Ulyses Jarred M.D.   On: 12/29/2016 20:14    Procedures Procedures (including critical care time)  Medications Ordered in ED Medications  prochlorperazine (COMPAZINE) injection 10 mg (10 mg Intravenous Given 12/29/16 1934)  diphenhydrAMINE (BENADRYL) injection 12.5 mg (12.5 mg Intravenous Given 12/29/16 1934)  lactated ringers bolus 1,000 mL (1,000 mLs Intravenous New Bag/Given 12/29/16 1933)     Initial Impression / Assessment and Plan / ED Course  I have reviewed the triage vital signs and the nursing notes.  Pertinent labs & imaging results that were available during my care of the patient were reviewed by me and considered in my medical decision making (see chart for details).     This is a 31 year old female with PMH of recently diagnosed nonepileptic seizures, migraines who presents from home after a fall at home.   Exam as noted above.  Limited by patient's effort.  Do not suspect focal neurological deficits given easily distractibility on exam and redirection with change in strength output.  CT head and C-spine performed which were negative for any acute intracranial pathology.  Patient given Compazine, Benadryl, IV fluids for her migraine which on reassessment she states dramatically improved her headache. Patient c-collar was cleared at bedside she will full range of motion of her neck with no rigidity or pain.  At this time no further diagnostic workup is indicated given patient's recovery to baseline, well-appearing clinical presentation, low suspicion for  acute intracranial process.  Regarding her syncope, chest x-ray was negative for any intrapulmonary findings, her troponin was negative, beta hCG negative, Alexza lites within normal range, no leukocytosis, no anemia.  No family history of sudden cardiac death, EKG reviewed. ECG is normal sinus rhythm and rate, without evidence of ST or T wave changes of myocardial ischemia.    No EKG findings of HOCM, WPW, Brugada, pre-excitation or prolonged QT. No tachycardia, no right ventricular heart strain suggestive of PE.    At this time, given age and lack of risk factors, I believe chest pain to be benign cause. Patient will be discharged home is follow up with neurologist for discussion of migraines vs conversion disorder vs MS or other cause of her presentation. Patient in agreement with plan.   All questions answered, return precautions given.  Final Clinical Impressions(s) / ED Diagnoses   Final diagnoses:  Syncope and collapse    New Prescriptions New Prescriptions   No medications on file     Aldona Lento, MD 12/29/16 2248    Tanna Furry, MD 12/29/16 [phone]

## 2016-12-30 ENCOUNTER — Encounter (HOSPITAL_COMMUNITY): Payer: Self-pay | Admitting: Emergency Medicine

## 2017-08-11 IMAGING — DX DG CHEST 2V
2 series · 2 of 2 positions shown · non-contrast
Comparison: 08/15/2015

CLINICAL DATA: Chest pain and cold symptoms

EXAM:
CHEST  2 VIEW

[chest pa]
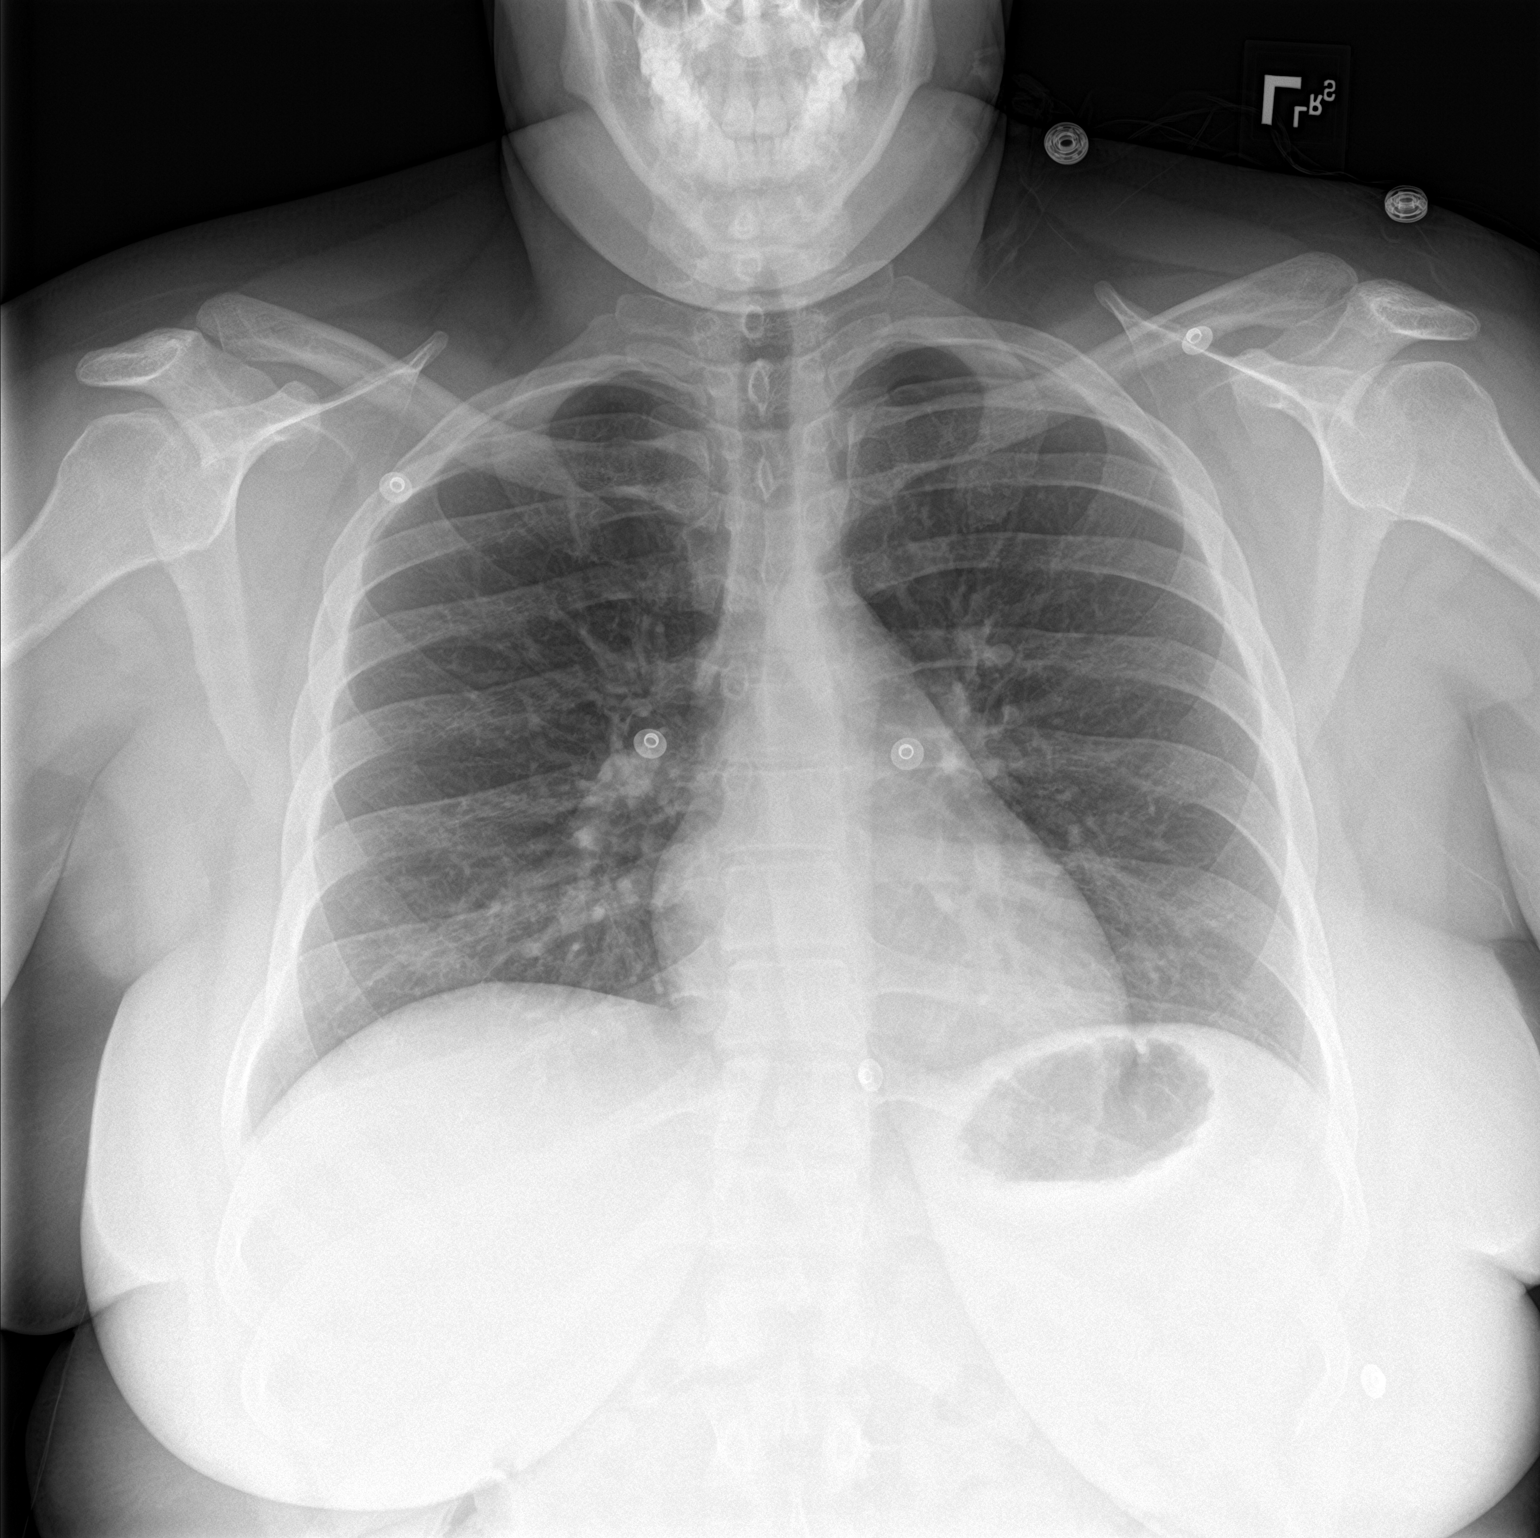

[chest lat]
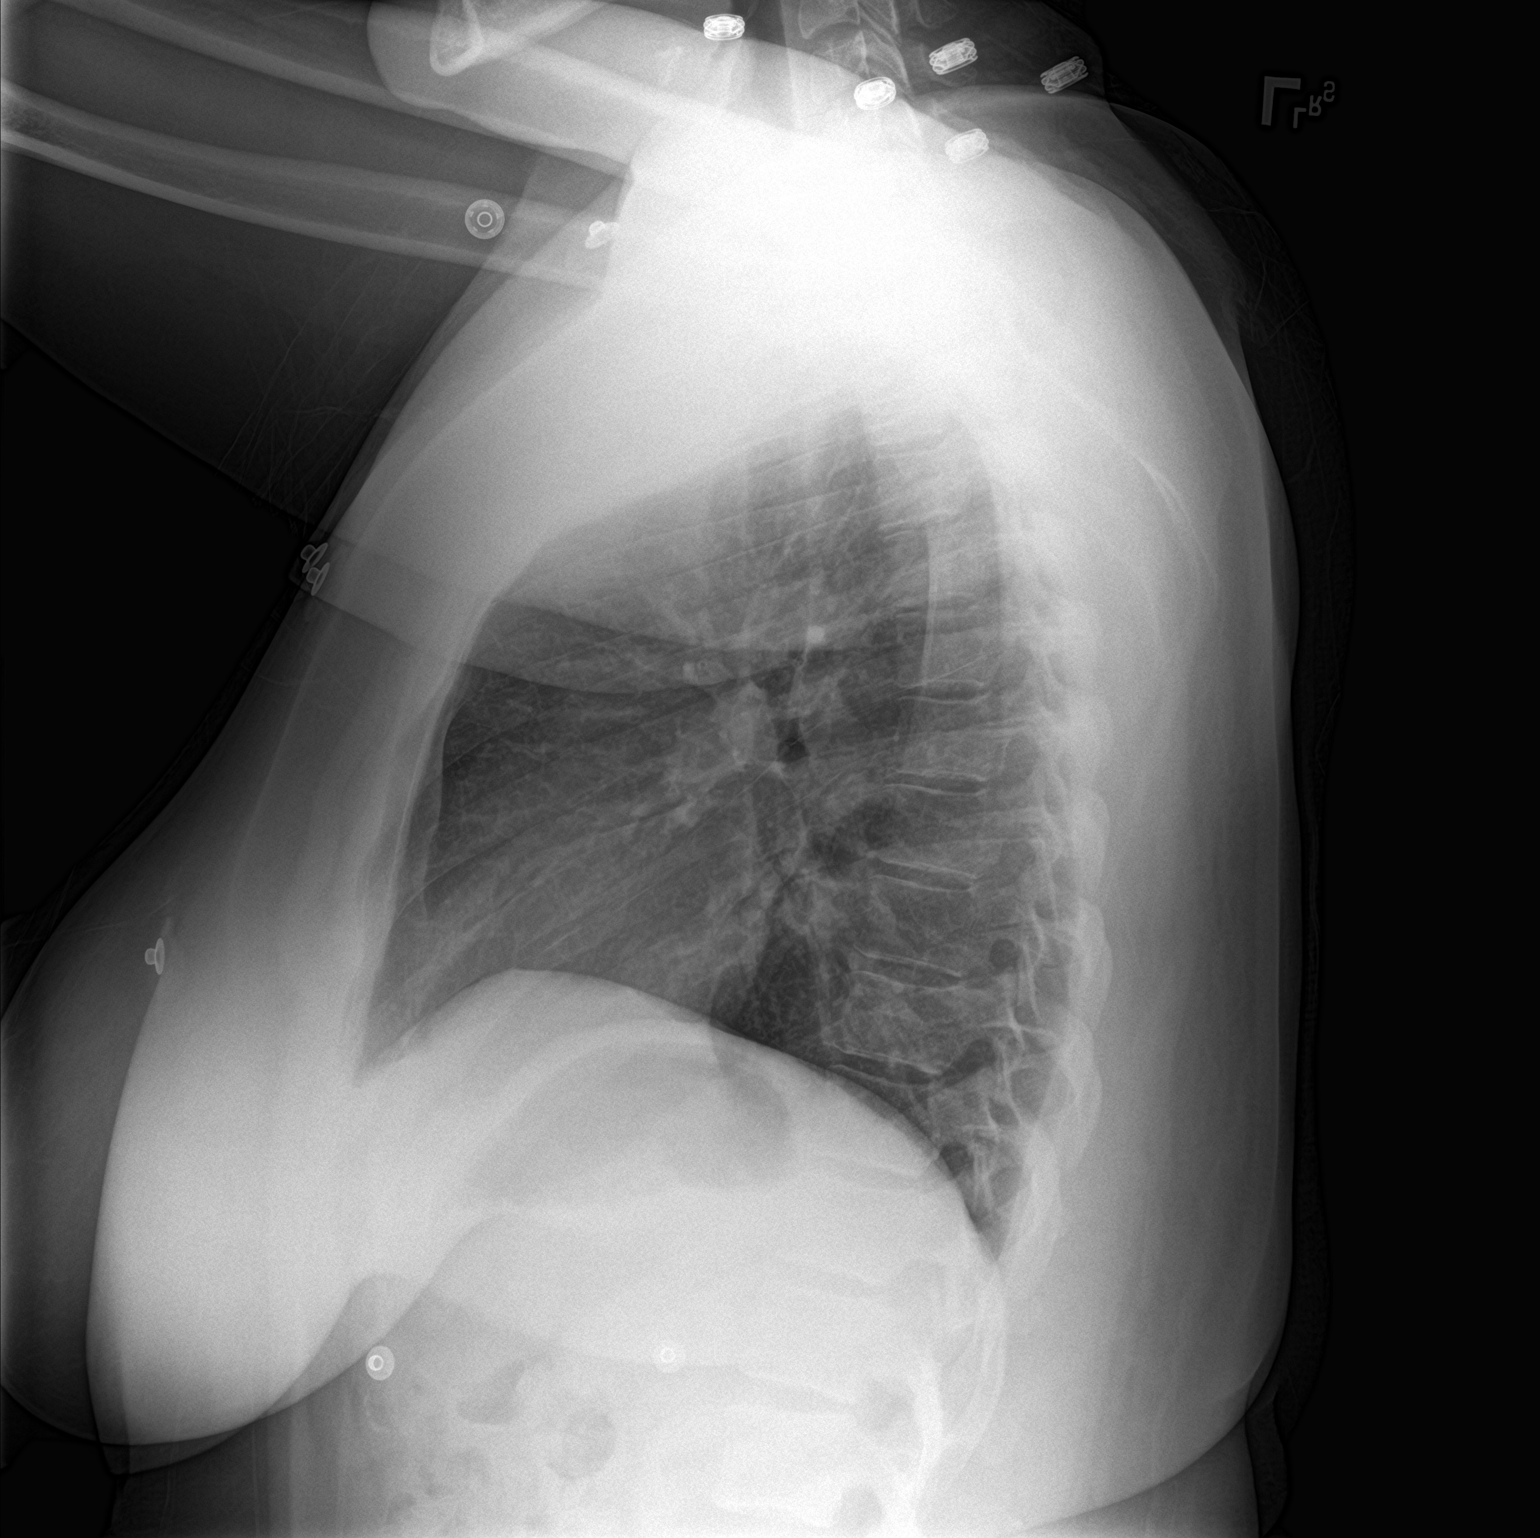

[2 of 2 positions shown; findings below may reference images not displayed]

FINDINGS: The heart size and mediastinal contours are within normal limits.
Both lungs are clear. The visualized skeletal structures are
unremarkable.
IMPRESSION: No active cardiopulmonary disease.

## 2018-04-09 IMAGING — CT CT CERVICAL SPINE W/O CM
4 of 8 series · 10 of 33 positions shown, 11 images · non-contrast
Comparison: None.

CLINICAL DATA: Syncope and fall

EXAM:
CT HEAD WITHOUT CONTRAST
CT CERVICAL SPINE WITHOUT CONTRAST
TECHNIQUE: Multidetector CT imaging of the head and cervical spine was
performed following the standard protocol without intravenous
contrast. Multiplanar CT image reconstructions of the cervical spine
were also generated.

[Series 5: c_spine 2.0 st · axial · 0.32mm/px · z∈[-198,-132]mm · 2 of 99 slices shown]
[im 33/99  bone]
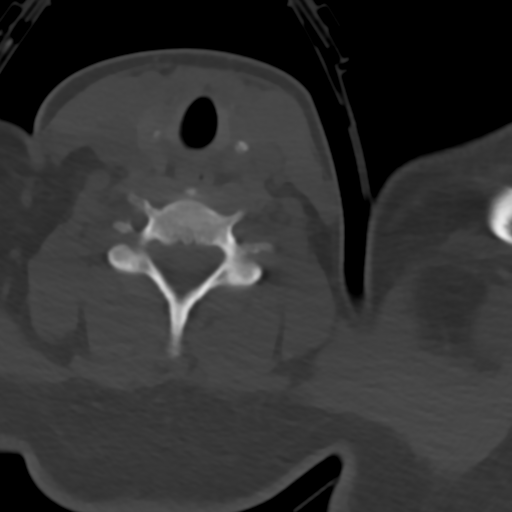
[im 66/99  bone]
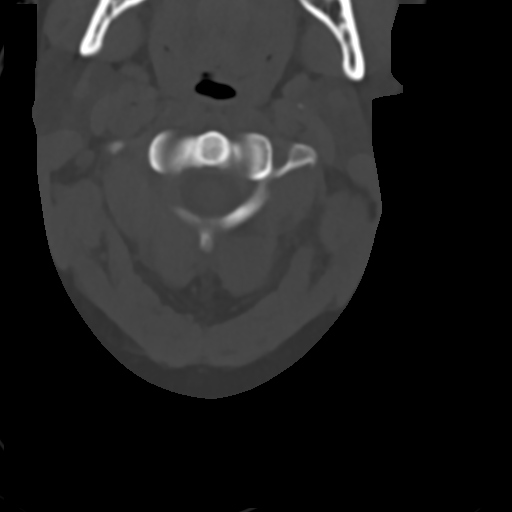

[Series 10: c_spine 2.0 sag bone · sagittal · 0.23mm/px · 4 of 45 slices shown]
[im 9/45  bone]
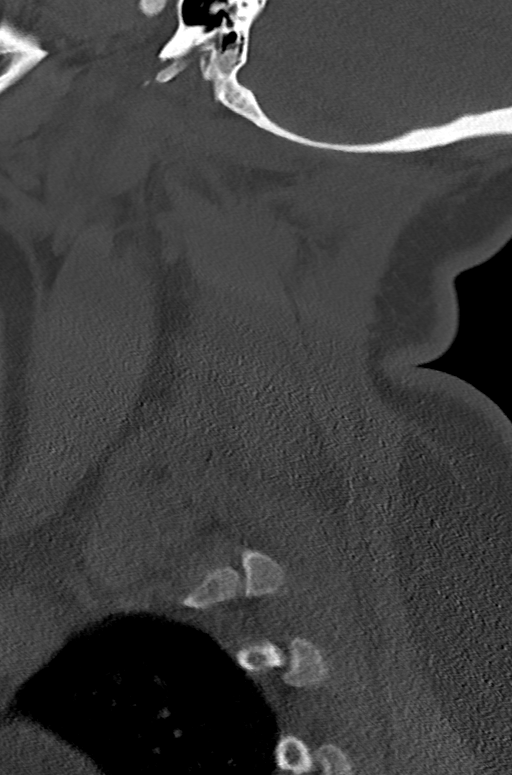
[im 18/45  bone]
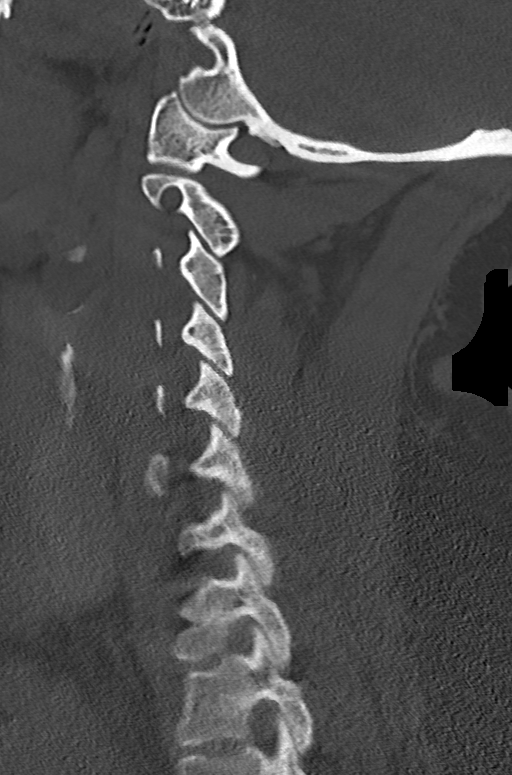
[im 27/45  bone]
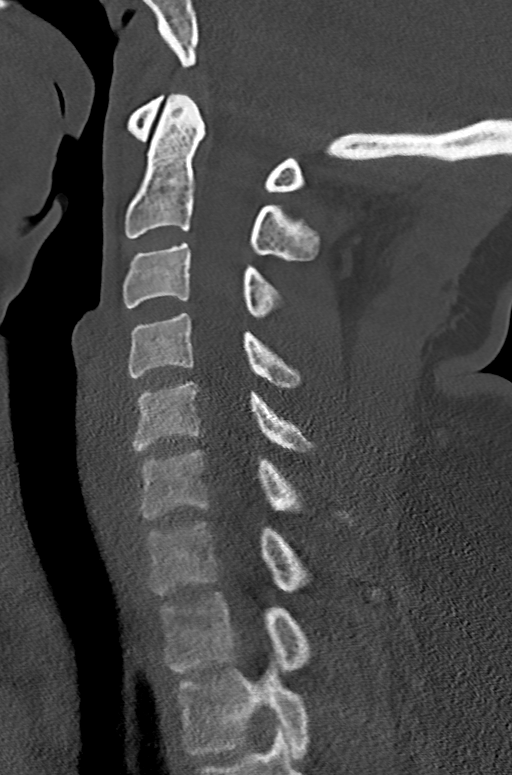
[im 36/45  bone]
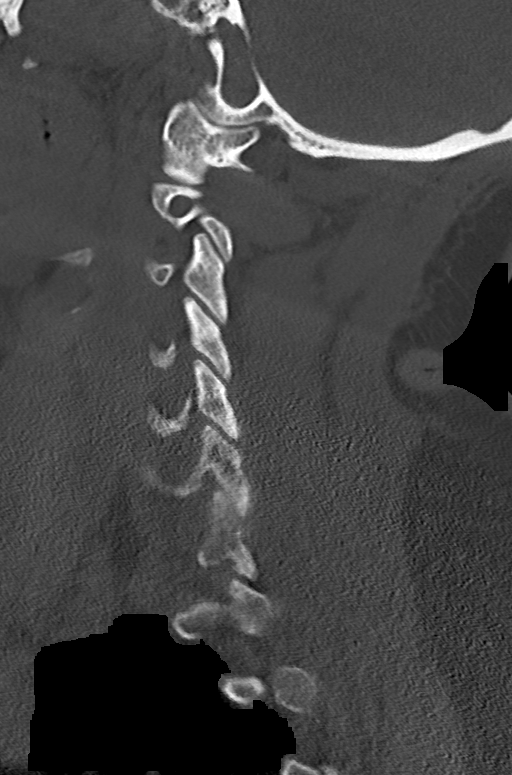

[Series 11: c_spine 2.0 cor bone · coronal · 0.25mm/px · 1 of 51 slices shown]
[im 26/51  bone]
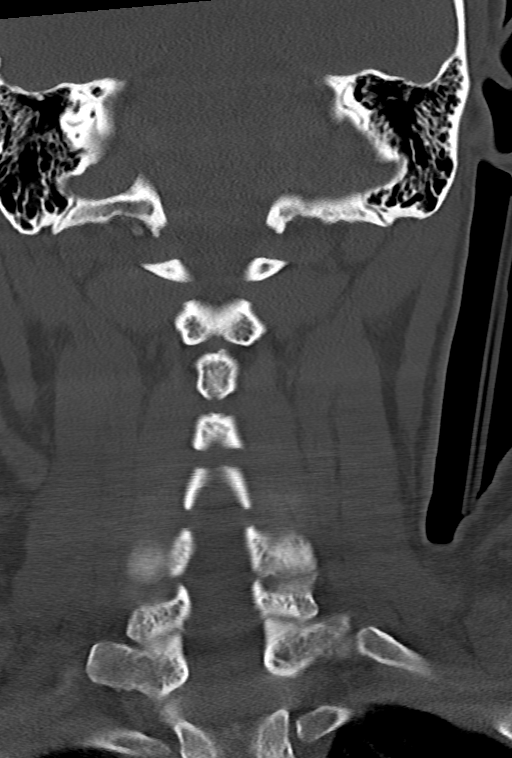

[Series 12: c_spine 2.0 orthogonals · axial · 0.21mm/px · z∈[-276,-165]mm · 3 of 125 slices shown, 4 images]
[im 32/125  soft-tissue]
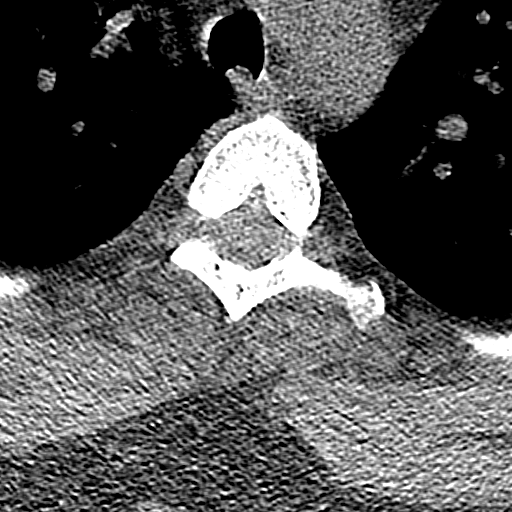
[im 32/125  bone]
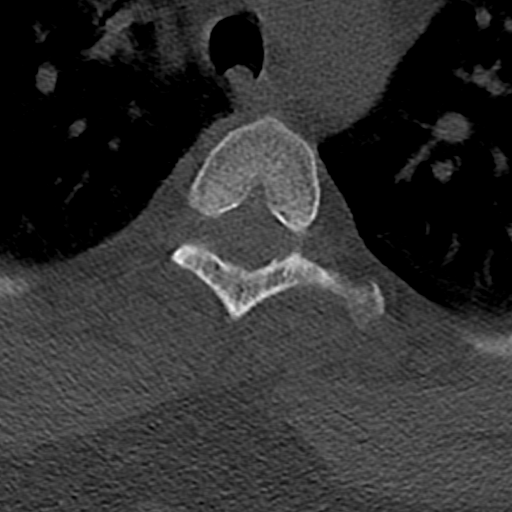
[im 63/125  bone]
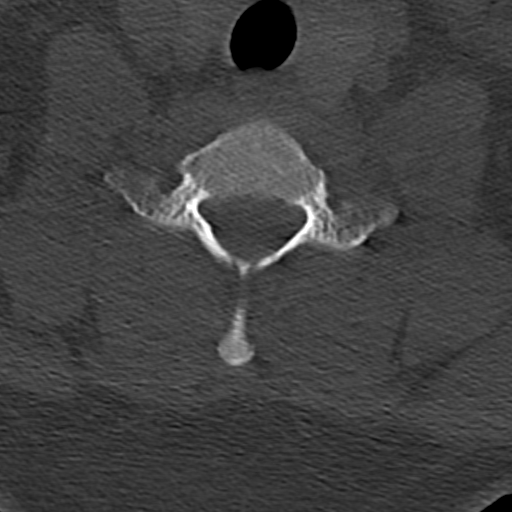
[im 94/125  bone]
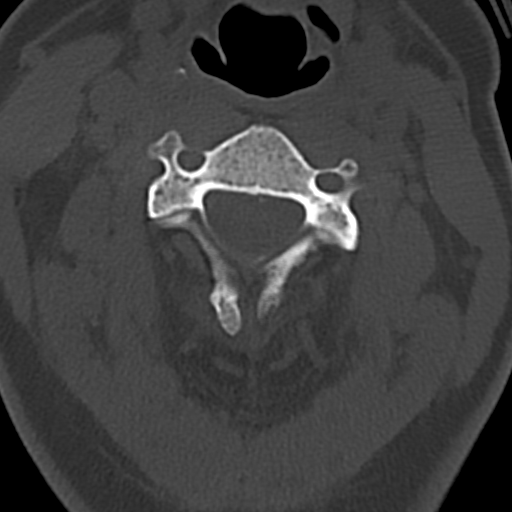

[10 of 33 positions shown; findings below may reference images not displayed]

FINDINGS: CT HEAD FINDINGS

Brain: No mass lesion, intraparenchymal hemorrhage or extra-axial
collection. No evidence of acute cortical infarct. Brain parenchyma
and CSF-containing spaces are normal for age.

Vascular: No hyperdense vessel or unexpected calcification.

Skull: Normal visualized skull base, calvarium and extracranial soft
tissues.

Sinuses/Orbits: No sinus fluid levels or advanced mucosal
thickening. No mastoid effusion. Normal orbits.

CT CERVICAL SPINE FINDINGS

Alignment: No static subluxation. Facets are aligned. Occipital
condyles are normally positioned.

Skull base and vertebrae: No acute fracture.

Soft tissues and spinal canal: No prevertebral fluid or swelling. No
visible canal hematoma.

Disc levels: No advanced spinal canal or neural foraminal stenosis.

Upper chest: No pneumothorax, pulmonary nodule or pleural effusion.

Other: Normal visualized paraspinal cervical soft tissues.
IMPRESSION: Normal CT of the head and cervical spine.
